# Patient Record
Sex: Female | Born: 1965
Health system: Southern US, Community
[De-identification: ages and names within clinical notes are randomized; demographics above are authoritative.]

## PROBLEM LIST (undated history)

## (undated) DIAGNOSIS — J45909 Unspecified asthma, uncomplicated: Secondary | ICD-10-CM

## (undated) DIAGNOSIS — R569 Unspecified convulsions: Secondary | ICD-10-CM

## (undated) DIAGNOSIS — E119 Type 2 diabetes mellitus without complications: Secondary | ICD-10-CM

## (undated) DIAGNOSIS — F319 Bipolar disorder, unspecified: Secondary | ICD-10-CM

## (undated) DIAGNOSIS — I10 Essential (primary) hypertension: Secondary | ICD-10-CM

## (undated) HISTORY — PX: BACK SURGERY: SHX140

---

## 2003-03-30 ENCOUNTER — Encounter: Admission: RE | Admit: 2003-03-30 | Discharge: 2003-03-30 | Payer: Self-pay | Admitting: Family Medicine

## 2005-07-06 ENCOUNTER — Other Ambulatory Visit: Payer: Self-pay

## 2005-07-06 ENCOUNTER — Inpatient Hospital Stay: Payer: Self-pay | Admitting: Psychiatry

## 2005-07-11 ENCOUNTER — Ambulatory Visit: Payer: Self-pay | Admitting: Unknown Physician Specialty

## 2005-08-03 ENCOUNTER — Ambulatory Visit: Payer: Self-pay | Admitting: Unknown Physician Specialty

## 2005-08-28 ENCOUNTER — Emergency Department: Payer: Self-pay | Admitting: Emergency Medicine

## 2005-09-03 ENCOUNTER — Ambulatory Visit: Payer: Self-pay | Admitting: Unknown Physician Specialty

## 2005-09-05 ENCOUNTER — Emergency Department: Payer: Self-pay | Admitting: Emergency Medicine

## 2008-12-29 ENCOUNTER — Encounter: Admission: RE | Admit: 2008-12-29 | Discharge: 2008-12-29 | Payer: Self-pay | Admitting: Specialist

## 2009-01-03 ENCOUNTER — Inpatient Hospital Stay (HOSPITAL_COMMUNITY): Admission: AD | Admit: 2009-01-03 | Discharge: 2009-01-05 | Payer: Self-pay | Admitting: Specialist

## 2009-02-20 ENCOUNTER — Inpatient Hospital Stay (HOSPITAL_COMMUNITY): Admission: EM | Admit: 2009-02-20 | Discharge: 2009-02-25 | Payer: Self-pay | Admitting: Emergency Medicine

## 2009-02-21 ENCOUNTER — Encounter (INDEPENDENT_AMBULATORY_CARE_PROVIDER_SITE_OTHER): Payer: Self-pay | Admitting: Internal Medicine

## 2009-02-22 ENCOUNTER — Encounter: Payer: Self-pay | Admitting: Internal Medicine

## 2009-02-22 ENCOUNTER — Encounter (INDEPENDENT_AMBULATORY_CARE_PROVIDER_SITE_OTHER): Payer: Self-pay | Admitting: Internal Medicine

## 2009-02-24 ENCOUNTER — Ambulatory Visit: Payer: Self-pay | Admitting: Psychiatry

## 2010-04-22 LAB — LIPID PANEL
HDL: 39 mg/dL — ABNORMAL LOW (ref >39)
Triglycerides: 105 mg/dL (ref <150)
VLDL: 21 mg/dL (ref 0–40)

## 2010-04-22 LAB — CBC
HCT: 37.3 % (ref 36.0–46.0)
HCT: 34.9 % — ABNORMAL LOW (ref 36.0–46.0)
HCT: 37.1 % (ref 36.0–46.0)
Hemoglobin: 12.6 g/dL (ref 12.0–15.0)
Hemoglobin: 15.4 g/dL — ABNORMAL HIGH (ref 12.0–15.0)
Hemoglobin: 12.2 g/dL (ref 12.0–15.0)
Hemoglobin: 13 g/dL (ref 12.0–15.0)
MCHC: 33.5 g/dL (ref 30.0–36.0)
MCHC: 33.8 g/dL (ref 30.0–36.0)
MCHC: 34.7 g/dL (ref 30.0–36.0)
MCHC: 34.9 g/dL (ref 30.0–36.0)
MCV: 96.5 fL (ref 78.0–100.0)
MCV: 97.3 fL (ref 78.0–100.0)
MCV: 97.4 fL (ref 78.0–100.0)
Platelets: 250 10*3/uL (ref 150–400)
Platelets: 190 10*3/uL (ref 150–400)
Platelets: 196 K/uL (ref 150–400)
Platelets: 203 10*3/uL (ref 150–400)
RBC: 4.72 MIL/uL (ref 3.87–5.11)
RBC: 3.58 MIL/uL — ABNORMAL LOW (ref 3.87–5.11)
RDW: 12.7 % (ref 11.5–15.5)
RDW: 12.6 % (ref 11.5–15.5)
RDW: 12.6 % (ref 11.5–15.5)
RDW: 12.7 % (ref 11.5–15.5)
WBC: 11.2 K/uL — ABNORMAL HIGH (ref 4.0–10.5)
WBC: 4.7 10*3/uL (ref 4.0–10.5)

## 2010-04-22 LAB — GLUCOSE, CAPILLARY
Glucose-Capillary: 157 mg/dL — ABNORMAL HIGH (ref 70–99)
Glucose-Capillary: 109 mg/dL — ABNORMAL HIGH (ref 70–99)
Glucose-Capillary: 111 mg/dL — ABNORMAL HIGH (ref 70–99)
Glucose-Capillary: 112 mg/dL — ABNORMAL HIGH (ref 70–99)
Glucose-Capillary: 113 mg/dL — ABNORMAL HIGH (ref 70–99)
Glucose-Capillary: 117 mg/dL — ABNORMAL HIGH (ref 70–99)
Glucose-Capillary: 124 mg/dL — ABNORMAL HIGH (ref 70–99)
Glucose-Capillary: 128 mg/dL — ABNORMAL HIGH (ref 70–99)
Glucose-Capillary: 132 mg/dL — ABNORMAL HIGH (ref 70–99)
Glucose-Capillary: 65 mg/dL — ABNORMAL LOW (ref 70–99)
Glucose-Capillary: 83 mg/dL (ref 70–99)
Glucose-Capillary: 87 mg/dL (ref 70–99)
Glucose-Capillary: 91 mg/dL (ref 70–99)
Glucose-Capillary: 91 mg/dL (ref 70–99)
Glucose-Capillary: 94 mg/dL (ref 70–99)

## 2010-04-22 LAB — COMPREHENSIVE METABOLIC PANEL
ALT: <8 U/L (ref 0–35)
Alkaline Phosphatase: 63 U/L (ref 39–117)
CO2: 12 mEq/L — ABNORMAL LOW (ref 19–32)
GFR calc non Af Amer: 34 mL/min — ABNORMAL LOW (ref >60)
Glucose, Bld: 222 mg/dL — ABNORMAL HIGH (ref 70–99)
Potassium: 3.5 mEq/L (ref 3.5–5.1)
Sodium: 140 mEq/L (ref 135–145)

## 2010-04-22 LAB — RAPID URINE DRUG SCREEN, HOSP PERFORMED
Benzodiazepines: NOT DETECTED
Cocaine: NOT DETECTED
Opiates: NOT DETECTED

## 2010-04-22 LAB — BLOOD GAS, ARTERIAL
Drawn by: 257701
O2 Content: 2 L/min
Patient temperature: 98.6
pCO2 arterial: 31.8 mmHg — ABNORMAL LOW (ref 35.0–45.0)
pH, Arterial: 7.422 — ABNORMAL HIGH (ref 7.350–7.400)

## 2010-04-22 LAB — CK TOTAL AND CKMB (NOT AT ARMC): Total CK: 1728 U/L — ABNORMAL HIGH (ref 7–177)

## 2010-04-22 LAB — BASIC METABOLIC PANEL
BUN: 10 mg/dL (ref 6–23)
BUN: 6 mg/dL (ref 6–23)
CO2: 20 mEq/L (ref 19–32)
Calcium: 8.2 mg/dL — ABNORMAL LOW (ref 8.4–10.5)
Chloride: 108 mEq/L (ref 96–112)
Glucose, Bld: 104 mg/dL — ABNORMAL HIGH (ref 70–99)
Glucose, Bld: 95 mg/dL (ref 70–99)
Potassium: 3.2 mEq/L — ABNORMAL LOW (ref 3.5–5.1)
Potassium: 3.5 mEq/L (ref 3.5–5.1)
Sodium: 140 mEq/L (ref 135–145)
Sodium: 144 mEq/L (ref 135–145)

## 2010-04-22 LAB — PHOSPHORUS: Phosphorus: 2.6 mg/dL (ref 2.3–4.6)

## 2010-04-22 LAB — COMPREHENSIVE METABOLIC PANEL WITH GFR
ALT: 17 U/L (ref 0–35)
AST: 32 U/L (ref 0–37)
Albumin: 3.4 g/dL — ABNORMAL LOW (ref 3.5–5.2)
Alkaline Phosphatase: 41 U/L (ref 39–117)
BUN: 3 mg/dL — ABNORMAL LOW (ref 6–23)
CO2: 18 meq/L — ABNORMAL LOW (ref 19–32)
Calcium: 8.5 mg/dL (ref 8.4–10.5)
Chloride: 111 meq/L (ref 96–112)
Creatinine, Ser: 0.61 mg/dL (ref 0.4–1.2)
GFR calc non Af Amer: >60 mL/min
Glucose, Bld: 83 mg/dL (ref 70–99)
Potassium: 4.2 meq/L (ref 3.5–5.1)
Sodium: 140 meq/L (ref 135–145)
Total Bilirubin: 1.6 mg/dL — ABNORMAL HIGH (ref 0.3–1.2)
Total Protein: 6.2 g/dL (ref 6.0–8.3)

## 2010-04-22 LAB — BLOOD GAS, VENOUS
Bicarbonate: 10.4 mEq/L — ABNORMAL LOW (ref 20.0–24.0)
O2 Saturation: 94.2 %
Patient temperature: 98.6
TCO2: 9.3 mmol/L (ref 0–100)
pH, Ven: 7.245 — ABNORMAL LOW (ref 7.250–7.300)

## 2010-04-22 LAB — BASIC METABOLIC PANEL WITH GFR
BUN: <1 mg/dL — ABNORMAL LOW (ref 6–23)
CO2: 25 meq/L (ref 19–32)
Calcium: 8.7 mg/dL (ref 8.4–10.5)
Chloride: 108 meq/L (ref 96–112)
Creatinine, Ser: 0.61 mg/dL (ref 0.4–1.2)
GFR calc non Af Amer: >60 mL/min
Glucose, Bld: 99 mg/dL (ref 70–99)
Potassium: 3.3 meq/L — ABNORMAL LOW (ref 3.5–5.1)
Sodium: 141 meq/L (ref 135–145)

## 2010-04-22 LAB — URINALYSIS, ROUTINE W REFLEX MICROSCOPIC
Ketones, ur: 40 mg/dL — AB
Leukocytes, UA: NEGATIVE
Nitrite: NEGATIVE
Nitrite: NEGATIVE
Protein, ur: 30 mg/dL — AB
Protein, ur: NEGATIVE mg/dL
Specific Gravity, Urine: 1.024 (ref 1.005–1.030)
Urobilinogen, UA: 0.2 mg/dL (ref 0.0–1.0)

## 2010-04-22 LAB — CULTURE, BLOOD (ROUTINE X 2)
Culture: NO GROWTH
Culture: NO GROWTH
Culture: NO GROWTH
Culture: NO GROWTH

## 2010-04-22 LAB — DIFFERENTIAL
Basophils Relative: 0 % (ref 0–1)
Eosinophils Absolute: 0 10*3/uL (ref 0.0–0.7)
Neutrophils Relative %: 78 % — ABNORMAL HIGH (ref 43–77)

## 2010-04-22 LAB — URINE CULTURE: Culture: NO GROWTH

## 2010-04-22 LAB — MAGNESIUM
Magnesium: 2 mg/dL (ref 1.5–2.5)
Magnesium: 2.1 mg/dL (ref 1.5–2.5)
Magnesium: 2.2 mg/dL (ref 1.5–2.5)

## 2010-04-22 LAB — CSF CELL COUNT WITH DIFFERENTIAL: WBC, CSF: 0 /mm3 (ref 0–5)

## 2010-04-22 LAB — CARDIAC PANEL(CRET KIN+CKTOT+MB+TROPI)
CK, MB: 3.9 ng/mL (ref 0.3–4.0)
Troponin I: 0.16 ng/mL — ABNORMAL HIGH (ref 0.00–0.06)

## 2010-04-22 LAB — D-DIMER, QUANTITATIVE: D-Dimer, Quant: 0.61 ug/mL-FEU — ABNORMAL HIGH (ref 0.00–0.48)

## 2010-04-22 LAB — PROTEIN AND GLUCOSE, CSF
Glucose, CSF: 66 mg/dL (ref 43–76)
Total  Protein, CSF: 51 mg/dL — ABNORMAL HIGH (ref 15–45)

## 2010-04-22 LAB — T3, FREE: T3, Free: 2.2 pg/mL — ABNORMAL LOW (ref 2.3–4.2)

## 2010-04-22 LAB — C-REACTIVE PROTEIN: CRP: 2.3 mg/dL — ABNORMAL HIGH

## 2010-04-22 LAB — PROTIME-INR
INR: 1.04 (ref 0.00–1.49)
Prothrombin Time: 13.5 s (ref 11.6–15.2)

## 2010-04-22 LAB — TSH: TSH: 0.434 u[IU]/mL (ref 0.350–4.500)

## 2010-04-22 LAB — AMMONIA
Ammonia: 124 umol/L — ABNORMAL HIGH (ref 11–35)
Ammonia: 20 umol/L (ref 11–35)

## 2010-04-22 LAB — CK: Total CK: 359 U/L — ABNORMAL HIGH (ref 7–177)

## 2010-04-22 LAB — ETHANOL: Alcohol, Ethyl (B): <5 mg/dL (ref 0–10)

## 2010-04-22 LAB — TROPONIN I: Troponin I: 0.09 ng/mL — ABNORMAL HIGH (ref 0.00–0.06)

## 2010-04-22 LAB — POCT CARDIAC MARKERS: Myoglobin, poc: >500 ng/mL (ref 12–200)

## 2010-04-22 LAB — SEDIMENTATION RATE: Sed Rate: 41 mm/h — ABNORMAL HIGH (ref 0–22)

## 2010-04-22 LAB — URINE MICROSCOPIC-ADD ON

## 2010-04-22 LAB — SALICYLATE LEVEL: Salicylate Lvl: <4 mg/dL (ref 2.8–20.0)

## 2010-04-22 LAB — POCT PREGNANCY, URINE: Preg Test, Ur: NEGATIVE

## 2010-05-07 LAB — CBC
HCT: 38.5 % (ref 36.0–46.0)
HCT: 42.9 % (ref 36.0–46.0)
Hemoglobin: 14.6 g/dL (ref 12.0–15.0)
MCHC: 34 g/dL (ref 30.0–36.0)
MCV: 95.1 fL (ref 78.0–100.0)
MCV: 96.1 fL (ref 78.0–100.0)
Platelets: 196 10*3/uL (ref 150–400)
RBC: 4.51 MIL/uL (ref 3.87–5.11)
RDW: 12.6 % (ref 11.5–15.5)
RDW: 12.9 % (ref 11.5–15.5)

## 2010-05-07 LAB — URINALYSIS, ROUTINE W REFLEX MICROSCOPIC
Bilirubin Urine: NEGATIVE
Nitrite: NEGATIVE
Protein, ur: NEGATIVE mg/dL
Specific Gravity, Urine: 1.025 (ref 1.005–1.030)
Urobilinogen, UA: 0.2 mg/dL (ref 0.0–1.0)

## 2010-05-07 LAB — COMPREHENSIVE METABOLIC PANEL
AST: 13 U/L (ref 0–37)
CO2: 22 mEq/L (ref 19–32)
Chloride: 103 mEq/L (ref 96–112)
Creatinine, Ser: 0.69 mg/dL (ref 0.4–1.2)
GFR calc Af Amer: >60 mL/min (ref >60)
GFR calc non Af Amer: >60 mL/min (ref >60)
Total Bilirubin: 0.8 mg/dL (ref 0.3–1.2)

## 2010-05-07 LAB — PREGNANCY, URINE: Preg Test, Ur: NEGATIVE

## 2010-05-07 LAB — DIFFERENTIAL
Basophils Absolute: 0 10*3/uL (ref 0.0–0.1)
Basophils Relative: 0 % (ref 0–1)
Eosinophils Absolute: 0 10*3/uL (ref 0.0–0.7)
Eosinophils Relative: 0 % (ref 0–5)
Lymphocytes Relative: 11 % — ABNORMAL LOW (ref 12–46)

## 2010-05-07 LAB — APTT: aPTT: 31 seconds (ref 24–37)

## 2010-05-07 LAB — URINE MICROSCOPIC-ADD ON

## 2014-12-13 ENCOUNTER — Encounter: Payer: Self-pay | Admitting: *Deleted

## 2014-12-13 ENCOUNTER — Emergency Department
Admission: EM | Admit: 2014-12-13 | Discharge: 2014-12-13 | Disposition: A | Discharge status: Home / Self Care | Payer: Self-pay | Attending: Emergency Medicine | Admitting: Emergency Medicine

## 2014-12-13 ENCOUNTER — Emergency Department: Payer: Self-pay

## 2014-12-13 DIAGNOSIS — Z3202 Encounter for pregnancy test, result negative: Secondary | ICD-10-CM | POA: Insufficient documentation

## 2014-12-13 DIAGNOSIS — R109 Unspecified abdominal pain: Secondary | ICD-10-CM

## 2014-12-13 DIAGNOSIS — R252 Cramp and spasm: Secondary | ICD-10-CM | POA: Insufficient documentation

## 2014-12-13 DIAGNOSIS — R195 Other fecal abnormalities: Secondary | ICD-10-CM

## 2014-12-13 DIAGNOSIS — E119 Type 2 diabetes mellitus without complications: Secondary | ICD-10-CM | POA: Insufficient documentation

## 2014-12-13 DIAGNOSIS — R55 Syncope and collapse: Secondary | ICD-10-CM | POA: Insufficient documentation

## 2014-12-13 DIAGNOSIS — Z79899 Other long term (current) drug therapy: Secondary | ICD-10-CM | POA: Insufficient documentation

## 2014-12-13 DIAGNOSIS — Z720 Tobacco use: Secondary | ICD-10-CM | POA: Insufficient documentation

## 2014-12-13 DIAGNOSIS — R0789 Other chest pain: Secondary | ICD-10-CM | POA: Insufficient documentation

## 2014-12-13 DIAGNOSIS — I10 Essential (primary) hypertension: Secondary | ICD-10-CM | POA: Insufficient documentation

## 2014-12-13 DIAGNOSIS — R197 Diarrhea, unspecified: Secondary | ICD-10-CM | POA: Insufficient documentation

## 2014-12-13 HISTORY — DX: Essential (primary) hypertension: I10

## 2014-12-13 HISTORY — DX: Unspecified asthma, uncomplicated: J45.909

## 2014-12-13 HISTORY — DX: Unspecified convulsions: R56.9

## 2014-12-13 HISTORY — DX: Type 2 diabetes mellitus without complications: E11.9

## 2014-12-13 HISTORY — DX: Bipolar disorder, unspecified: F31.9

## 2014-12-13 LAB — URINALYSIS COMPLETE WITH MICROSCOPIC (ARMC ONLY)
BILIRUBIN URINE: NEGATIVE
Bacteria, UA: NONE SEEN
Glucose, UA: NEGATIVE mg/dL
HGB URINE DIPSTICK: NEGATIVE
LEUKOCYTES UA: NEGATIVE
NITRITE: NEGATIVE
PH: 6 (ref 5.0–8.0)
Protein, ur: 100 mg/dL — AB
SPECIFIC GRAVITY, URINE: 1.016 (ref 1.005–1.030)

## 2014-12-13 LAB — BASIC METABOLIC PANEL
ANION GAP: 6 (ref 5–15)
BUN: 11 mg/dL (ref 6–20)
CHLORIDE: 102 mmol/L (ref 101–111)
CO2: 27 mmol/L (ref 22–32)
Calcium: 9.1 mg/dL (ref 8.9–10.3)
Creatinine, Ser: 0.91 mg/dL (ref 0.44–1.00)
GFR calc Af Amer: >60 mL/min (ref >60)
Glucose, Bld: 134 mg/dL — ABNORMAL HIGH (ref 65–99)
POTASSIUM: 3.8 mmol/L (ref 3.5–5.1)
SODIUM: 135 mmol/L (ref 135–145)

## 2014-12-13 LAB — CBC
HEMATOCRIT: 41.5 % (ref 35.0–47.0)
HEMOGLOBIN: 13.4 g/dL (ref 12.0–16.0)
MCH: 28.4 pg (ref 26.0–34.0)
MCHC: 32.4 g/dL (ref 32.0–36.0)
MCV: 87.7 fL (ref 80.0–100.0)
Platelets: 317 10*3/uL (ref 150–440)
RBC: 4.73 MIL/uL (ref 3.80–5.20)
RDW: 14.6 % — ABNORMAL HIGH (ref 11.5–14.5)
WBC: 12 10*3/uL — AB (ref 3.6–11.0)

## 2014-12-13 LAB — PREGNANCY, URINE: Preg Test, Ur: NEGATIVE

## 2014-12-13 LAB — TROPONIN I: Troponin I: <0.03 ng/mL (ref <0.031)

## 2014-12-13 MED ORDER — SODIUM CHLORIDE 0.9 % IV BOLUS (SEPSIS)
1000.0000 mL | Freq: Once | INTRAVENOUS | Status: AC
  Administered 2014-12-13: 1000 mL via INTRAVENOUS

## 2014-12-13 MED ORDER — ACETAMINOPHEN 500 MG PO TABS
1000.0000 mg | ORAL_TABLET | ORAL | Status: AC
  Administered 2014-12-13: 1000 mg via ORAL
  Filled 2014-12-13: qty 2

## 2014-12-13 MED ORDER — ONDANSETRON HCL 4 MG/2ML IJ SOLN
4.0000 mg | Freq: Once | INTRAMUSCULAR | Status: AC
  Administered 2014-12-13: 4 mg via INTRAVENOUS
  Filled 2014-12-13: qty 2

## 2014-12-13 NOTE — ED Notes (Signed)
Patient transported to X-ray 

## 2014-12-13 NOTE — ED Notes (Addendum)
Pt to ED via EMS from home with c/c of left sided rib pain/chest pain, along with numbness in left arm x 4 days. Per EMS x 4 hours today abd cramping lower quads bilaterally. LMP x 2 months ago, last BM x 3 days ago. EMS states pt became diaphoretic while using restroom, was slumped over when husband walked in to check on pt, "felt like i was going to pass out but i didn't" Pt AAOx3 on arrival, appears pale in color, vitals wnl at this time. Pain 7/10 BGL 103 per EMS

## 2014-12-13 NOTE — ED Notes (Signed)
Lab called regarding urine preg add on, will add on at this time

## 2014-12-13 NOTE — ED Provider Notes (Signed)
[facility] Emergency Department Provider Note REMINDER - THIS NOTE IS NOT A FINAL MEDICAL RECORD UNTIL IT IS SIGNED. UNTIL THEN, THE CONTENT BELOW MAY REFLECT INFORMATION FROM A DOCUMENTATION TEMPLATE, NOT THE ACTUAL PATIENT VISIT. ____________________________________________  Time seen: Approximately 5:55 PM  I have reviewed the triage vital signs and the nursing notes.   HISTORY  Chief Complaint Chest Pain and Loss of Consciousness    HPI Karen Perry is a 49 y.o. female who presents for evaluation of approximately 3 days of an aching pain that runs from her left back into her left-sided ribs and chest.She reports that she gets sharp discomfort in the left chest especially when she lays down at night, not associated with any shortness of breath. She's been told she has a herniated disc in her mid back and believes the pain may be radiating out from that area.  No numbness or tingling except she had a brief episode where her arm felt a little tingly after a blood pressure cuff was applied at home on it and this resolved once the blood pressure is removed. She denies a nausea or vomiting.  Patient also notes that today she went out and had lunch, she then the having crampy pain and felt constipated. While sitting on the toilet to have a bowel movement she felt lightheaded, but did not pass out. She had very sweaty, slumped over and felt as though she is going to pass out. She now reports having had approximately 3 loose stools and diarrhea the ER with some crampy lower abdominal discomfort.  No fevers or chills. No trouble breathing. She does not believe she is pregnant the last period was 2 months ago. The patient also tells me she does not wish to be treated with any narcotics as she has a previous history of addiction.   Past Medical History  Diagnosis Date  . Asthma   . Hypertension   . Diabetes mellitus without complication (HCC)   . Seizures  (HCC)   . Bipolar 1 disorder (HCC)     There are no active problems to display for this patient.   Past Surgical History  Procedure Laterality Date  . Back surgery      Current Outpatient Rx  Name  Route  Sig  Dispense  Refill  . busPIRone (BUSPAR) 10 MG tablet   Oral   Take 10 mg by mouth 2 (two) times daily.         . fluPHENAZine (PROLIXIN) 1 MG tablet   Oral   Take 1 mg by mouth at bedtime.         . lamoTRIgine (LAMICTAL) 100 MG tablet   Oral   Take 200 mg by mouth daily.         . traZODone (DESYREL) 50 MG tablet   Oral   Take 25-50 mg by mouth at bedtime as needed for sleep.           Allergies Septra  History reviewed. No pertinent family history.  Social History Social History  Substance Use Topics  . Smoking status: Current Every Day Smoker -- 0.50 packs/day    Types: Cigarettes  . Smokeless tobacco: None  . Alcohol Use: No    Review of Systems Constitutional: No fever/chills Eyes: No visual changes. ENT: No sore throat. Cardiovascular: See history of present illness Respiratory: Denies shortness of breath. Gastrointestinal: No abdominal pain.  No nausea, no vomiting.  No diarrhea.  No constipation. Genitourinary: Negative for dysuria. Musculoskeletal:  Negative for back pain. Skin: Negative for rash. Neurological: Negative for headaches, focal weakness or numbness.  10-point ROS otherwise negative. No leg swelling, no recent surgeries or trauma. No previous history of blood clots. She is a smoker but does not take any estrogen or hormones. No previous history of any DVT. Denies shortness of breath. No cough or hemoptysis. ____________________________________________   PHYSICAL EXAM:  VITAL SIGNS: ED Triage Vitals  Enc Vitals Group     BP 12/13/14 1609 117/84 mmHg     Pulse Rate 12/13/14 1609 92     Resp 12/13/14 1609 14     Temp 12/13/14 1609 98.1 F (36.7 C)     Temp Source 12/13/14 1609 Oral     SpO2 12/13/14 1609 98 %      Weight 12/13/14 1609 207 lb (93.895 kg)     Height 12/13/14 1609  (1.753 m)     Head Cir --      Peak Flow --      Pain Score 12/13/14 1610 7     Pain Loc --      Pain Edu? --      Excl. in GC? --    Constitutional: Alert and oriented. Well appearing and in no acute distress. Eyes: Conjunctivae are normal. PERRL. EOMI. Head: Atraumatic. Nose: No congestion/rhinnorhea. Mouth/Throat: Mucous membranes are moist.  Oropharynx non-erythematous. Neck: No stridor.   Cardiovascular: Normal rate, regular rhythm. Grossly normal heart sounds.  Good peripheral circulation. Respiratory: Normal respiratory effort.  No retractions. Lungs CTAB. Gastrointestinal: Soft and nontender, except for the patient reports a crampy feeling with palpation over the bladder and suprapubic region. No distention. No abdominal bruits. No CVA tenderness. Musculoskeletal: No lower extremity tenderness nor edema.  No joint effusions. Neurologic:  Normal speech and language. No gross focal neurologic deficits are appreciated. No gait instability. Skin:  Skin is warm, dry and intact. No rash noted. Psychiatric: Mood and affect are normal. Speech and behavior are normal.  ____________________________________________   LABS (all labs ordered are listed, but only abnormal results are displayed)  Labs Reviewed  BASIC METABOLIC PANEL - Abnormal; Notable for the following:    Glucose, Bld 134 (*)    All other components within normal limits  CBC - Abnormal; Notable for the following:    WBC 12.0 (*)    RDW 14.6 (*)    All other components within normal limits  URINALYSIS COMPLETEWITH MICROSCOPIC ([facility] ONLY) - Abnormal; Notable for the following:    Color, Urine AMBER (*)    APPearance CLEAR (*)    Ketones, ur TRACE (*)    Protein, ur 100 (*)    Squamous Epithelial / LPF 0-5 (*)    All other components within normal limits  TROPONIN I  CBG MONITORING, ED    ____________________________________________  EKG  ED ECG REPORT I, QUALE, MARK, the attending physician, personally viewed and interpreted this ECG.  Date: 12/13/2014 EKG Time: 1608 Rate: 97 Rhythm: normal sinus rhythm QRS Axis: normal Intervals: normal ST/T Wave abnormalities: normal Conduction Disutrbances: none Narrative Interpretation: unremarkable  ____________________________________________  RADIOLOGY  DG Chest 2 View (Final result) Result time: 12/13/14 17:01:38   Final result by Rad Results In Interface (12/13/14 17:01:38)   Narrative:   CLINICAL DATA: Left-sided chest pain for 4 days  EXAM: CHEST - 2 VIEW  COMPARISON: 02/20/2009  FINDINGS: Cardiac shadow is within normal limits. The lungs are well aerated bilaterally. No focal infiltrate or sizable effusion is seen. No acute bony  abnormality is noted.  IMPRESSION: No active disease.     ____________________________________________   PROCEDURES  Procedure(s) performed: None  Critical Care performed: No  ____________________________________________   INITIAL IMPRESSION / ASSESSMENT AND PLAN / ED COURSE  Pertinent labs & imaging results that were available during my care of the patient were reviewed by me and considered in my medical decision making (see chart for details).  Patient presents for a multiple concerns, some which are hard to correlate into one etiology. She does note 3-4 days of achy sharp pain that shoots up from the left back towards the left ribs and chest. This is very atypical coronary syndrome, she does not a history of coronary disease though she does have diabetes. EKG is completely normal and her first troponin is normal. I find this extremely unlikely and her heart score indicates she is low risk. She has no significant risk factor and is perc negative for pulmonary embolism. No signs or symptoms of dissection. No evidence pneumothorax with clear chest x-ray.  She  did have also what sounds to be some sort of episode where she almost passed out today while feeling constipated, now having 3-4 loose nonbloody stools and reporting her symptoms are better except for crampy abdominal pain. I'll give her Zofran and hydrate, her abdominal exam is only indicative of some mild cramping with palpation suprapubically but no vaginal symptoms and no evidence of peritonitis, fever or signs or symptoms of infection.  She does have a mild leukocytosis, I will check a urinalysis and repeat her abdominal exam after hydration and Zofran.      Pulmonary Embolism Rule-out Criteria (PERC rule)                        If YES to ANY of the following, the PERC rule is not satisfied and cannot be used to rule out PE in this patient (consider d-dimer or imaging depending on pre-test probability).                      If NO to ALL of the following, AND the clinician's pre-test probability is <15%, the [facility] rule is satisfied and there is no need for further workup (including no need to obtain a d-dimer) as the post-test probability of pulmonary embolism is <2%.                      Mnemonic is HAD CLOTS   H - hormone use (exogenous estrogen)      No. A - age > 50                                                 No. D - DVT/PE history                                      No.   C - coughing blood (hemoptysis)                 No. L - leg swelling, unilateral                             No. O - O2 Sat on Room  Air < 95%                  No. T - tachycardia (HR ? 100)                         No. S - surgery or trauma, recent                      No.   Based on my evaluation of the patient, including application of this decision instrument, further testing to evaluate for pulmonary embolism is not indicated at this time. I have discussed this recommendation with the patient who states understanding and agreement with this plan.  ----------------------------------------- 8:03 PM on  12/13/2014 -----------------------------------------  Patient reports feeling improvement. No further loose stools, she reports that her abdominal pain is gone. Repeat exam she has no abdominal pain or tenderness. She reports that her chest pain is also improved, and she is currently resting comfortably. I discussed careful return precautions, including if she has worsening chest pain, short of breath, feels weak lightheaded or feels as though she may pass out again, develops a fever, severe abdominal pain, bloody stools, nausea or vomiting then she'll return emergency room right away.  At this point, patient appears stable, her pain and symptoms are much improved. Careful return precautions and follow-up plan in place, the patient is agreeable. No evidence of acute intra-abdominal infection with reassuring repeat exam. Diarrhea resolved. Low risk heart score and atypical chest discomfort, normal EKG normal troponin. We'll discharge the patient home with close follow-up care advised.  ____________________________________________   FINAL CLINICAL IMPRESSION(S) / ED DIAGNOSES  Final diagnoses:  Chest wall discomfort  Loose stools  Cramp, abdominal  Vasovagal episode      Sharyn Creamer, MD 12/13/14 2006

## 2014-12-13 NOTE — Discharge Instructions (Signed)
You have been seen in the Emergency Department (ED) today for chest pain.  As we have discussed todays test results are normal, but you may require further testing.  Please follow up with the recommended doctor as instructed above in these documents regarding todays emergent visit and your recent symptoms to discuss further management.  Continue to take your regular medications. If you are not doing so already, please also take a daily baby aspirin (81 mg), at least until you follow up with your doctor.  Return to the Emergency Department (ED) if you experience any further chest pain/pressure/tightness, difficulty breathing, or sudden sweating, or other symptoms that concern you.   Abdominal Pain, Adult Many things can cause abdominal pain. Usually, abdominal pain is not caused by a disease and will improve without treatment. It can often be observed and treated at home. Your health care provider will do a physical exam and possibly order blood tests and X-rays to help determine the seriousness of your pain. However, in many cases, more time must pass before a clear cause of the pain can be found. Before that point, your health care provider may not know if you need more testing or further treatment. HOME CARE INSTRUCTIONS Monitor your abdominal pain for any changes. The following actions may help to alleviate any discomfort you are experiencing:  Only take over-the-counter or prescription medicines as directed by your health care provider.  Do not take laxatives unless directed to do so by your health care provider.  Try a clear liquid diet (broth, tea, or water) as directed by your health care provider. Slowly move to a bland diet as tolerated. SEEK MEDICAL CARE IF:  You have unexplained abdominal pain.  You have abdominal pain associated with nausea or diarrhea.  You have pain when you urinate or have a bowel movement.  You experience abdominal pain that wakes you in the night.  You  have abdominal pain that is worsened or improved by eating food.  You have abdominal pain that is worsened with eating fatty foods.  You have a fever. SEEK IMMEDIATE MEDICAL CARE IF:  Your pain does not go away within 2 hours.  You keep throwing up (vomiting).  Your pain is felt only in portions of the abdomen, such as the right side or the left lower portion of the abdomen.  You pass bloody or black tarry stools. MAKE SURE YOU:  Understand these instructions.  Will watch your condition.  Will get help right away if you are not doing well or get worse.   This information is not intended to replace advice given to you by your health care provider. Make sure you discuss any questions you have with your health care provider.   Document Released: 10/30/2004 Document Revised: 10/11/2014 Document Reviewed: 09/29/2012 Elsevier Interactive Patient Education 2016 [employer]  Chest Pain Observation It is often hard to give a specific diagnosis for the cause of chest pain. Among other possibilities your symptoms might be caused by inadequate oxygen delivery to your heart (angina). Angina that is not treated or evaluated can lead to a heart attack (myocardial infarction) or death. Blood tests, electrocardiograms, and X-rays may have been done to help determine a possible cause of your chest pain. After evaluation and observation, your health care provider has determined that it is unlikely your pain was caused by an unstable condition that requires hospitalization. However, a full evaluation of your pain may need to be completed, with additional diagnostic testing as directed. It  is very important to keep your follow-up appointments. Not keeping your follow-up appointments could result in permanent heart damage, disability, or death. If there is any problem keeping your follow-up appointments, you must call your health care provider. HOME CARE INSTRUCTIONS  Due to the slight chance that your  pain could be angina, it is important to follow your health care provider's treatment plan and also maintain a healthy lifestyle:  Maintain or work toward achieving a healthy weight.  Stay physically active and exercise regularly.  Decrease your salt intake.  Eat a balanced, healthy diet. Talk to a dietitian to learn about heart-healthy foods.  Increase your fiber intake by including whole grains, vegetables, fruits, and nuts in your diet.  Avoid situations that cause stress, anger, or depression.  Take medicines as advised by your health care provider. Report any side effects to your health care provider. Do not stop medicines or adjust the dosages on your own.  Quit smoking. Do not use nicotine patches or gum until you check with your health care provider.  Keep your blood pressure, blood sugar, and cholesterol levels within normal limits.  Limit alcohol intake to no more than 1 drink per day for women who are not pregnant and 2 drinks per day for men.  Do not abuse drugs. SEEK IMMEDIATE MEDICAL CARE IF: You have severe chest pain or pressure which may include symptoms such as:  You feel pain or pressure in your arms, neck, jaw, or back.  You have severe back or abdominal pain, feel sick to your stomach (nauseous), or throw up (vomit).  You are sweating profusely.  You are having a fast or irregular heartbeat.  You feel short of breath while at rest.  You notice increasing shortness of breath during rest, sleep, or with activity.  You have chest pain that does not get better after rest or after taking your usual medicine.  You wake from sleep with chest pain.  You are unable to sleep because you cannot breathe.  You develop a frequent cough or you are coughing up blood.  You feel dizzy, faint, or experience extreme fatigue.  You develop severe weakness, dizziness, fainting, or chills. Any of these symptoms may represent a serious problem that is an emergency. Do not  wait to see if the symptoms will go away. Call your local emergency services (911 in the [place] Do not drive yourself to the hospital. MAKE SURE YOU:  Understand these instructions.  Will watch your condition.  Will get help right away if you are not doing well or get worse.   This information is not intended to replace advice given to you by your health care provider. Make sure you discuss any questions you have with your health care provider.   Document Released: 02/22/2010 Document Revised: 01/25/2013 Document Reviewed: 07/22/2012 Elsevier Interactive Patient Education [employer]

## 2014-12-13 NOTE — ED Notes (Signed)
Pt will be d/c once preg results. Pt made aware and verbalized understanding at this time

## 2015-02-22 ENCOUNTER — Ambulatory Visit: Payer: Self-pay | Admitting: Cardiovascular Disease

## 2018-01-08 ENCOUNTER — Other Ambulatory Visit: Payer: Self-pay | Admitting: Internal Medicine

## 2018-01-08 DIAGNOSIS — Q891 Congenital malformations of adrenal gland: Secondary | ICD-10-CM

## 2018-01-08 DIAGNOSIS — R7989 Other specified abnormal findings of blood chemistry: Secondary | ICD-10-CM

## 2018-01-08 DIAGNOSIS — I10 Essential (primary) hypertension: Secondary | ICD-10-CM

## 2018-02-10 ENCOUNTER — Ambulatory Visit
Admission: RE | Admit: 2018-02-10 | Discharge: 2018-02-10 | Disposition: A | Discharge status: Home / Self Care | Payer: Medicare HMO | Source: Ambulatory Visit | Attending: Internal Medicine | Admitting: Internal Medicine

## 2018-02-10 DIAGNOSIS — Q891 Congenital malformations of adrenal gland: Secondary | ICD-10-CM | POA: Insufficient documentation

## 2018-02-10 DIAGNOSIS — R7989 Other specified abnormal findings of blood chemistry: Secondary | ICD-10-CM | POA: Diagnosis not present

## 2018-02-10 DIAGNOSIS — K449 Diaphragmatic hernia without obstruction or gangrene: Secondary | ICD-10-CM | POA: Diagnosis not present

## 2018-02-10 DIAGNOSIS — I10 Essential (primary) hypertension: Secondary | ICD-10-CM | POA: Diagnosis not present

## 2018-02-10 DIAGNOSIS — K8020 Calculus of gallbladder without cholecystitis without obstruction: Secondary | ICD-10-CM | POA: Diagnosis not present

## 2018-02-15 ENCOUNTER — Other Ambulatory Visit: Payer: Self-pay | Admitting: Internal Medicine

## 2018-02-15 DIAGNOSIS — Z1231 Encounter for screening mammogram for malignant neoplasm of breast: Secondary | ICD-10-CM

## 2018-02-18 DIAGNOSIS — D751 Secondary polycythemia: Secondary | ICD-10-CM | POA: Diagnosis not present

## 2018-02-18 DIAGNOSIS — F319 Bipolar disorder, unspecified: Secondary | ICD-10-CM | POA: Diagnosis not present

## 2018-02-18 DIAGNOSIS — E118 Type 2 diabetes mellitus with unspecified complications: Secondary | ICD-10-CM | POA: Diagnosis not present

## 2018-02-18 DIAGNOSIS — J4540 Moderate persistent asthma, uncomplicated: Secondary | ICD-10-CM | POA: Diagnosis not present

## 2018-02-18 DIAGNOSIS — I10 Essential (primary) hypertension: Secondary | ICD-10-CM | POA: Diagnosis not present

## 2018-02-18 DIAGNOSIS — E559 Vitamin D deficiency, unspecified: Secondary | ICD-10-CM | POA: Diagnosis not present

## 2018-02-18 DIAGNOSIS — M5136 Other intervertebral disc degeneration, lumbar region: Secondary | ICD-10-CM | POA: Diagnosis not present

## 2018-03-08 ENCOUNTER — Other Ambulatory Visit (INDEPENDENT_AMBULATORY_CARE_PROVIDER_SITE_OTHER): Payer: Self-pay | Admitting: Internal Medicine

## 2018-03-08 DIAGNOSIS — I10 Essential (primary) hypertension: Secondary | ICD-10-CM

## 2018-03-09 ENCOUNTER — Ambulatory Visit (INDEPENDENT_AMBULATORY_CARE_PROVIDER_SITE_OTHER): Payer: Medicare HMO

## 2018-03-09 DIAGNOSIS — I10 Essential (primary) hypertension: Secondary | ICD-10-CM | POA: Diagnosis not present

## 2018-03-17 DIAGNOSIS — M5136 Other intervertebral disc degeneration, lumbar region: Secondary | ICD-10-CM | POA: Diagnosis not present

## 2018-03-17 DIAGNOSIS — E118 Type 2 diabetes mellitus with unspecified complications: Secondary | ICD-10-CM | POA: Diagnosis not present

## 2018-03-17 DIAGNOSIS — E559 Vitamin D deficiency, unspecified: Secondary | ICD-10-CM | POA: Diagnosis not present

## 2018-03-17 DIAGNOSIS — I10 Essential (primary) hypertension: Secondary | ICD-10-CM | POA: Diagnosis not present

## 2018-03-17 DIAGNOSIS — D751 Secondary polycythemia: Secondary | ICD-10-CM | POA: Diagnosis not present

## 2018-03-17 DIAGNOSIS — F319 Bipolar disorder, unspecified: Secondary | ICD-10-CM | POA: Diagnosis not present

## 2018-03-17 DIAGNOSIS — J4540 Moderate persistent asthma, uncomplicated: Secondary | ICD-10-CM | POA: Diagnosis not present

## 2018-03-31 ENCOUNTER — Encounter: Payer: Self-pay | Admitting: Radiology

## 2018-03-31 ENCOUNTER — Ambulatory Visit
Admission: RE | Admit: 2018-03-31 | Discharge: 2018-03-31 | Disposition: A | Discharge status: Home / Self Care | Payer: Medicare HMO | Source: Ambulatory Visit | Attending: Internal Medicine | Admitting: Internal Medicine

## 2018-03-31 DIAGNOSIS — Z1231 Encounter for screening mammogram for malignant neoplasm of breast: Secondary | ICD-10-CM

## 2018-05-26 DIAGNOSIS — J069 Acute upper respiratory infection, unspecified: Secondary | ICD-10-CM | POA: Diagnosis not present

## 2018-08-09 DIAGNOSIS — D751 Secondary polycythemia: Secondary | ICD-10-CM | POA: Diagnosis not present

## 2018-08-09 DIAGNOSIS — E118 Type 2 diabetes mellitus with unspecified complications: Secondary | ICD-10-CM | POA: Diagnosis not present

## 2018-08-09 DIAGNOSIS — E559 Vitamin D deficiency, unspecified: Secondary | ICD-10-CM | POA: Diagnosis not present

## 2018-08-09 DIAGNOSIS — I10 Essential (primary) hypertension: Secondary | ICD-10-CM | POA: Diagnosis not present

## 2018-08-16 DIAGNOSIS — F319 Bipolar disorder, unspecified: Secondary | ICD-10-CM | POA: Diagnosis not present

## 2018-08-16 DIAGNOSIS — Z124 Encounter for screening for malignant neoplasm of cervix: Secondary | ICD-10-CM | POA: Diagnosis not present

## 2018-08-16 DIAGNOSIS — Z0000 Encounter for general adult medical examination without abnormal findings: Secondary | ICD-10-CM | POA: Diagnosis not present

## 2018-08-16 DIAGNOSIS — E559 Vitamin D deficiency, unspecified: Secondary | ICD-10-CM | POA: Diagnosis not present

## 2018-08-16 DIAGNOSIS — I10 Essential (primary) hypertension: Secondary | ICD-10-CM | POA: Diagnosis not present

## 2018-08-16 DIAGNOSIS — J4540 Moderate persistent asthma, uncomplicated: Secondary | ICD-10-CM | POA: Diagnosis not present

## 2018-08-16 DIAGNOSIS — Z23 Encounter for immunization: Secondary | ICD-10-CM | POA: Diagnosis not present

## 2018-08-16 DIAGNOSIS — M5136 Other intervertebral disc degeneration, lumbar region: Secondary | ICD-10-CM | POA: Diagnosis not present

## 2018-08-16 DIAGNOSIS — E118 Type 2 diabetes mellitus with unspecified complications: Secondary | ICD-10-CM | POA: Diagnosis not present

## 2018-10-07 ENCOUNTER — Other Ambulatory Visit: Payer: Self-pay

## 2018-10-07 DIAGNOSIS — R6889 Other general symptoms and signs: Secondary | ICD-10-CM | POA: Diagnosis not present

## 2018-10-07 DIAGNOSIS — Z20822 Contact with and (suspected) exposure to covid-19: Secondary | ICD-10-CM

## 2018-10-08 LAB — NOVEL CORONAVIRUS, NAA: SARS-CoV-2, NAA: NOT DETECTED

## 2018-11-29 DIAGNOSIS — R109 Unspecified abdominal pain: Secondary | ICD-10-CM | POA: Diagnosis not present

## 2018-11-29 DIAGNOSIS — Z87891 Personal history of nicotine dependence: Secondary | ICD-10-CM | POA: Diagnosis not present

## 2018-11-29 DIAGNOSIS — R3915 Urgency of urination: Secondary | ICD-10-CM | POA: Diagnosis not present

## 2018-11-29 DIAGNOSIS — I10 Essential (primary) hypertension: Secondary | ICD-10-CM | POA: Diagnosis not present

## 2018-12-06 DIAGNOSIS — R0789 Other chest pain: Secondary | ICD-10-CM | POA: Diagnosis not present

## 2018-12-06 DIAGNOSIS — M8580 Other specified disorders of bone density and structure, unspecified site: Secondary | ICD-10-CM | POA: Diagnosis not present

## 2018-12-06 DIAGNOSIS — M5136 Other intervertebral disc degeneration, lumbar region: Secondary | ICD-10-CM | POA: Diagnosis not present

## 2018-12-06 DIAGNOSIS — S2241XA Multiple fractures of ribs, right side, initial encounter for closed fracture: Secondary | ICD-10-CM | POA: Diagnosis not present

## 2018-12-06 DIAGNOSIS — J449 Chronic obstructive pulmonary disease, unspecified: Secondary | ICD-10-CM | POA: Diagnosis not present

## 2018-12-06 DIAGNOSIS — F319 Bipolar disorder, unspecified: Secondary | ICD-10-CM | POA: Diagnosis not present

## 2018-12-06 DIAGNOSIS — E559 Vitamin D deficiency, unspecified: Secondary | ICD-10-CM | POA: Diagnosis not present

## 2018-12-06 DIAGNOSIS — I10 Essential (primary) hypertension: Secondary | ICD-10-CM | POA: Diagnosis not present

## 2018-12-06 DIAGNOSIS — D751 Secondary polycythemia: Secondary | ICD-10-CM | POA: Diagnosis not present

## 2018-12-06 DIAGNOSIS — E119 Type 2 diabetes mellitus without complications: Secondary | ICD-10-CM | POA: Diagnosis not present

## 2019-02-09 DIAGNOSIS — E118 Type 2 diabetes mellitus with unspecified complications: Secondary | ICD-10-CM | POA: Diagnosis not present

## 2019-02-09 DIAGNOSIS — I10 Essential (primary) hypertension: Secondary | ICD-10-CM | POA: Diagnosis not present

## 2019-02-09 DIAGNOSIS — D582 Other hemoglobinopathies: Secondary | ICD-10-CM | POA: Diagnosis not present

## 2019-02-09 DIAGNOSIS — Z1159 Encounter for screening for other viral diseases: Secondary | ICD-10-CM | POA: Diagnosis not present

## 2019-02-16 DIAGNOSIS — E118 Type 2 diabetes mellitus with unspecified complications: Secondary | ICD-10-CM | POA: Diagnosis not present

## 2019-02-16 DIAGNOSIS — M8580 Other specified disorders of bone density and structure, unspecified site: Secondary | ICD-10-CM | POA: Diagnosis not present

## 2019-02-16 DIAGNOSIS — Z87891 Personal history of nicotine dependence: Secondary | ICD-10-CM | POA: Diagnosis not present

## 2019-02-16 DIAGNOSIS — F319 Bipolar disorder, unspecified: Secondary | ICD-10-CM | POA: Diagnosis not present

## 2019-02-16 DIAGNOSIS — E559 Vitamin D deficiency, unspecified: Secondary | ICD-10-CM | POA: Diagnosis not present

## 2019-02-16 DIAGNOSIS — J449 Chronic obstructive pulmonary disease, unspecified: Secondary | ICD-10-CM | POA: Diagnosis not present

## 2019-02-16 DIAGNOSIS — R002 Palpitations: Secondary | ICD-10-CM | POA: Diagnosis not present

## 2019-02-16 DIAGNOSIS — R232 Flushing: Secondary | ICD-10-CM | POA: Diagnosis not present

## 2019-02-16 DIAGNOSIS — M5136 Other intervertebral disc degeneration, lumbar region: Secondary | ICD-10-CM | POA: Diagnosis not present

## 2019-03-14 DIAGNOSIS — R002 Palpitations: Secondary | ICD-10-CM | POA: Diagnosis not present

## 2019-08-10 DIAGNOSIS — D582 Other hemoglobinopathies: Secondary | ICD-10-CM | POA: Diagnosis not present

## 2019-08-10 DIAGNOSIS — E785 Hyperlipidemia, unspecified: Secondary | ICD-10-CM | POA: Diagnosis not present

## 2019-08-10 DIAGNOSIS — E118 Type 2 diabetes mellitus with unspecified complications: Secondary | ICD-10-CM | POA: Diagnosis not present

## 2019-08-17 DIAGNOSIS — R102 Pelvic and perineal pain: Secondary | ICD-10-CM | POA: Diagnosis not present

## 2019-08-17 DIAGNOSIS — M5136 Other intervertebral disc degeneration, lumbar region: Secondary | ICD-10-CM | POA: Diagnosis not present

## 2019-08-17 DIAGNOSIS — E119 Type 2 diabetes mellitus without complications: Secondary | ICD-10-CM | POA: Diagnosis not present

## 2019-08-17 DIAGNOSIS — F319 Bipolar disorder, unspecified: Secondary | ICD-10-CM | POA: Diagnosis not present

## 2019-08-17 DIAGNOSIS — I10 Essential (primary) hypertension: Secondary | ICD-10-CM | POA: Diagnosis not present

## 2019-08-17 DIAGNOSIS — J449 Chronic obstructive pulmonary disease, unspecified: Secondary | ICD-10-CM | POA: Diagnosis not present

## 2019-08-17 DIAGNOSIS — E559 Vitamin D deficiency, unspecified: Secondary | ICD-10-CM | POA: Diagnosis not present

## 2019-08-17 DIAGNOSIS — D751 Secondary polycythemia: Secondary | ICD-10-CM | POA: Diagnosis not present

## 2019-08-17 DIAGNOSIS — Z0000 Encounter for general adult medical examination without abnormal findings: Secondary | ICD-10-CM | POA: Diagnosis not present

## 2019-11-14 DIAGNOSIS — J449 Chronic obstructive pulmonary disease, unspecified: Secondary | ICD-10-CM | POA: Diagnosis not present

## 2019-11-14 DIAGNOSIS — I10 Essential (primary) hypertension: Secondary | ICD-10-CM | POA: Diagnosis not present

## 2019-11-14 DIAGNOSIS — Z1211 Encounter for screening for malignant neoplasm of colon: Secondary | ICD-10-CM | POA: Diagnosis not present

## 2019-11-14 DIAGNOSIS — M5136 Other intervertebral disc degeneration, lumbar region: Secondary | ICD-10-CM | POA: Diagnosis not present

## 2019-11-14 DIAGNOSIS — E559 Vitamin D deficiency, unspecified: Secondary | ICD-10-CM | POA: Diagnosis not present

## 2019-11-14 DIAGNOSIS — Z23 Encounter for immunization: Secondary | ICD-10-CM | POA: Diagnosis not present

## 2019-11-14 DIAGNOSIS — E785 Hyperlipidemia, unspecified: Secondary | ICD-10-CM | POA: Diagnosis not present

## 2019-11-14 DIAGNOSIS — E118 Type 2 diabetes mellitus with unspecified complications: Secondary | ICD-10-CM | POA: Diagnosis not present

## 2019-11-14 DIAGNOSIS — F319 Bipolar disorder, unspecified: Secondary | ICD-10-CM | POA: Diagnosis not present

## 2019-11-30 DIAGNOSIS — Z20822 Contact with and (suspected) exposure to covid-19: Secondary | ICD-10-CM | POA: Diagnosis not present

## 2019-12-05 DIAGNOSIS — Z1211 Encounter for screening for malignant neoplasm of colon: Secondary | ICD-10-CM | POA: Diagnosis not present

## 2020-02-06 DIAGNOSIS — D582 Other hemoglobinopathies: Secondary | ICD-10-CM | POA: Diagnosis not present

## 2020-02-06 DIAGNOSIS — E118 Type 2 diabetes mellitus with unspecified complications: Secondary | ICD-10-CM | POA: Diagnosis not present

## 2020-02-06 DIAGNOSIS — E785 Hyperlipidemia, unspecified: Secondary | ICD-10-CM | POA: Diagnosis not present

## 2020-02-22 DIAGNOSIS — F319 Bipolar disorder, unspecified: Secondary | ICD-10-CM | POA: Diagnosis not present

## 2020-02-22 DIAGNOSIS — J4540 Moderate persistent asthma, uncomplicated: Secondary | ICD-10-CM | POA: Diagnosis not present

## 2020-02-22 DIAGNOSIS — D582 Other hemoglobinopathies: Secondary | ICD-10-CM | POA: Diagnosis not present

## 2020-02-22 DIAGNOSIS — M5136 Other intervertebral disc degeneration, lumbar region: Secondary | ICD-10-CM | POA: Diagnosis not present

## 2020-02-22 DIAGNOSIS — J449 Chronic obstructive pulmonary disease, unspecified: Secondary | ICD-10-CM | POA: Diagnosis not present

## 2020-02-22 DIAGNOSIS — I10 Essential (primary) hypertension: Secondary | ICD-10-CM | POA: Diagnosis not present

## 2020-02-22 DIAGNOSIS — E118 Type 2 diabetes mellitus with unspecified complications: Secondary | ICD-10-CM | POA: Diagnosis not present

## 2020-02-22 DIAGNOSIS — E559 Vitamin D deficiency, unspecified: Secondary | ICD-10-CM | POA: Diagnosis not present

## 2020-03-24 DIAGNOSIS — I10 Essential (primary) hypertension: Secondary | ICD-10-CM | POA: Diagnosis not present

## 2020-03-24 DIAGNOSIS — N390 Urinary tract infection, site not specified: Secondary | ICD-10-CM | POA: Diagnosis not present

## 2020-04-03 DIAGNOSIS — J449 Chronic obstructive pulmonary disease, unspecified: Secondary | ICD-10-CM | POA: Diagnosis not present

## 2020-04-03 DIAGNOSIS — F319 Bipolar disorder, unspecified: Secondary | ICD-10-CM | POA: Diagnosis not present

## 2020-04-03 DIAGNOSIS — R3129 Other microscopic hematuria: Secondary | ICD-10-CM | POA: Diagnosis not present

## 2020-04-03 DIAGNOSIS — I10 Essential (primary) hypertension: Secondary | ICD-10-CM | POA: Diagnosis not present

## 2020-04-03 DIAGNOSIS — R300 Dysuria: Secondary | ICD-10-CM | POA: Diagnosis not present

## 2020-04-03 DIAGNOSIS — R1031 Right lower quadrant pain: Secondary | ICD-10-CM | POA: Diagnosis not present

## 2020-04-03 DIAGNOSIS — E118 Type 2 diabetes mellitus with unspecified complications: Secondary | ICD-10-CM | POA: Diagnosis not present

## 2020-04-05 ENCOUNTER — Other Ambulatory Visit (HOSPITAL_COMMUNITY): Payer: Self-pay | Admitting: Internal Medicine

## 2020-04-05 ENCOUNTER — Other Ambulatory Visit: Payer: Self-pay | Admitting: Internal Medicine

## 2020-04-05 DIAGNOSIS — R1031 Right lower quadrant pain: Secondary | ICD-10-CM

## 2020-04-05 DIAGNOSIS — R300 Dysuria: Secondary | ICD-10-CM

## 2020-04-06 ENCOUNTER — Ambulatory Visit
Admission: RE | Admit: 2020-04-06 | Discharge: 2020-04-06 | Disposition: A | Discharge status: Home / Self Care | Payer: Medicare HMO | Source: Ambulatory Visit | Attending: Internal Medicine | Admitting: Internal Medicine

## 2020-04-06 ENCOUNTER — Other Ambulatory Visit: Payer: Self-pay

## 2020-04-06 DIAGNOSIS — R1031 Right lower quadrant pain: Secondary | ICD-10-CM | POA: Diagnosis not present

## 2020-04-06 DIAGNOSIS — R300 Dysuria: Secondary | ICD-10-CM

## 2020-04-06 DIAGNOSIS — K8020 Calculus of gallbladder without cholecystitis without obstruction: Secondary | ICD-10-CM | POA: Diagnosis not present

## 2020-04-06 DIAGNOSIS — K429 Umbilical hernia without obstruction or gangrene: Secondary | ICD-10-CM | POA: Diagnosis not present

## 2020-04-06 DIAGNOSIS — K8080 Other cholelithiasis without obstruction: Secondary | ICD-10-CM | POA: Diagnosis not present

## 2020-04-06 DIAGNOSIS — R110 Nausea: Secondary | ICD-10-CM | POA: Diagnosis not present

## 2020-04-11 ENCOUNTER — Other Ambulatory Visit: Payer: Self-pay | Admitting: Internal Medicine

## 2020-04-11 DIAGNOSIS — K8020 Calculus of gallbladder without cholecystitis without obstruction: Secondary | ICD-10-CM

## 2020-04-11 DIAGNOSIS — R1011 Right upper quadrant pain: Secondary | ICD-10-CM

## 2020-04-17 DIAGNOSIS — K8020 Calculus of gallbladder without cholecystitis without obstruction: Secondary | ICD-10-CM | POA: Diagnosis not present

## 2020-04-17 DIAGNOSIS — I10 Essential (primary) hypertension: Secondary | ICD-10-CM | POA: Diagnosis not present

## 2020-04-17 DIAGNOSIS — E118 Type 2 diabetes mellitus with unspecified complications: Secondary | ICD-10-CM | POA: Diagnosis not present

## 2020-04-17 DIAGNOSIS — M5136 Other intervertebral disc degeneration, lumbar region: Secondary | ICD-10-CM | POA: Diagnosis not present

## 2020-04-17 DIAGNOSIS — J449 Chronic obstructive pulmonary disease, unspecified: Secondary | ICD-10-CM | POA: Diagnosis not present

## 2020-04-17 DIAGNOSIS — F319 Bipolar disorder, unspecified: Secondary | ICD-10-CM | POA: Diagnosis not present

## 2020-05-02 ENCOUNTER — Ambulatory Visit
Admission: RE | Admit: 2020-05-02 | Discharge: 2020-05-02 | Disposition: A | Discharge status: Home / Self Care | Payer: Medicare HMO | Source: Ambulatory Visit | Attending: Internal Medicine | Admitting: Internal Medicine

## 2020-05-02 ENCOUNTER — Other Ambulatory Visit: Payer: Self-pay

## 2020-05-02 DIAGNOSIS — K8020 Calculus of gallbladder without cholecystitis without obstruction: Secondary | ICD-10-CM | POA: Insufficient documentation

## 2020-05-02 DIAGNOSIS — R1011 Right upper quadrant pain: Secondary | ICD-10-CM | POA: Diagnosis not present

## 2020-05-02 MED ORDER — TECHNETIUM TC 99M MEBROFENIN IV KIT
5.0000 | PACK | Freq: Once | INTRAVENOUS | Status: AC | PRN
  Administered 2020-05-02: 5.239 via INTRAVENOUS

## 2020-08-21 DIAGNOSIS — D582 Other hemoglobinopathies: Secondary | ICD-10-CM | POA: Diagnosis not present

## 2020-08-21 DIAGNOSIS — E559 Vitamin D deficiency, unspecified: Secondary | ICD-10-CM | POA: Diagnosis not present

## 2020-08-21 DIAGNOSIS — E118 Type 2 diabetes mellitus with unspecified complications: Secondary | ICD-10-CM | POA: Diagnosis not present

## 2020-08-21 DIAGNOSIS — E785 Hyperlipidemia, unspecified: Secondary | ICD-10-CM | POA: Diagnosis not present

## 2020-08-28 DIAGNOSIS — I10 Essential (primary) hypertension: Secondary | ICD-10-CM | POA: Diagnosis not present

## 2020-08-28 DIAGNOSIS — E559 Vitamin D deficiency, unspecified: Secondary | ICD-10-CM | POA: Diagnosis not present

## 2020-08-28 DIAGNOSIS — F319 Bipolar disorder, unspecified: Secondary | ICD-10-CM | POA: Diagnosis not present

## 2020-08-28 DIAGNOSIS — I700 Atherosclerosis of aorta: Secondary | ICD-10-CM | POA: Diagnosis not present

## 2020-08-28 DIAGNOSIS — Z1211 Encounter for screening for malignant neoplasm of colon: Secondary | ICD-10-CM | POA: Diagnosis not present

## 2020-08-28 DIAGNOSIS — M5136 Other intervertebral disc degeneration, lumbar region: Secondary | ICD-10-CM | POA: Diagnosis not present

## 2020-08-28 DIAGNOSIS — Z1389 Encounter for screening for other disorder: Secondary | ICD-10-CM | POA: Diagnosis not present

## 2020-08-28 DIAGNOSIS — Z0000 Encounter for general adult medical examination without abnormal findings: Secondary | ICD-10-CM | POA: Diagnosis not present

## 2020-08-28 DIAGNOSIS — E785 Hyperlipidemia, unspecified: Secondary | ICD-10-CM | POA: Diagnosis not present

## 2020-08-28 DIAGNOSIS — D582 Other hemoglobinopathies: Secondary | ICD-10-CM | POA: Diagnosis not present

## 2020-08-28 DIAGNOSIS — E119 Type 2 diabetes mellitus without complications: Secondary | ICD-10-CM | POA: Diagnosis not present

## 2021-02-21 DIAGNOSIS — E118 Type 2 diabetes mellitus with unspecified complications: Secondary | ICD-10-CM | POA: Diagnosis not present

## 2021-02-21 DIAGNOSIS — E785 Hyperlipidemia, unspecified: Secondary | ICD-10-CM | POA: Diagnosis not present

## 2021-02-21 DIAGNOSIS — D582 Other hemoglobinopathies: Secondary | ICD-10-CM | POA: Diagnosis not present

## 2021-02-28 DIAGNOSIS — I700 Atherosclerosis of aorta: Secondary | ICD-10-CM | POA: Diagnosis not present

## 2021-02-28 DIAGNOSIS — F319 Bipolar disorder, unspecified: Secondary | ICD-10-CM | POA: Diagnosis not present

## 2021-02-28 DIAGNOSIS — I10 Essential (primary) hypertension: Secondary | ICD-10-CM | POA: Diagnosis not present

## 2021-02-28 DIAGNOSIS — M5136 Other intervertebral disc degeneration, lumbar region: Secondary | ICD-10-CM | POA: Diagnosis not present

## 2021-02-28 DIAGNOSIS — J4540 Moderate persistent asthma, uncomplicated: Secondary | ICD-10-CM | POA: Diagnosis not present

## 2021-02-28 DIAGNOSIS — E559 Vitamin D deficiency, unspecified: Secondary | ICD-10-CM | POA: Diagnosis not present

## 2021-02-28 DIAGNOSIS — E119 Type 2 diabetes mellitus without complications: Secondary | ICD-10-CM | POA: Diagnosis not present

## 2021-02-28 DIAGNOSIS — D582 Other hemoglobinopathies: Secondary | ICD-10-CM | POA: Diagnosis not present

## 2021-02-28 DIAGNOSIS — E785 Hyperlipidemia, unspecified: Secondary | ICD-10-CM | POA: Diagnosis not present

## 2021-03-04 DIAGNOSIS — N3000 Acute cystitis without hematuria: Secondary | ICD-10-CM | POA: Diagnosis not present

## 2021-03-04 DIAGNOSIS — R350 Frequency of micturition: Secondary | ICD-10-CM | POA: Diagnosis not present

## 2021-05-03 DIAGNOSIS — R0602 Shortness of breath: Secondary | ICD-10-CM | POA: Diagnosis not present

## 2021-05-03 DIAGNOSIS — J45909 Unspecified asthma, uncomplicated: Secondary | ICD-10-CM | POA: Diagnosis not present

## 2021-05-03 DIAGNOSIS — R609 Edema, unspecified: Secondary | ICD-10-CM | POA: Diagnosis not present

## 2021-05-06 DIAGNOSIS — I700 Atherosclerosis of aorta: Secondary | ICD-10-CM | POA: Diagnosis not present

## 2021-05-06 DIAGNOSIS — I10 Essential (primary) hypertension: Secondary | ICD-10-CM | POA: Diagnosis not present

## 2021-05-06 DIAGNOSIS — J4541 Moderate persistent asthma with (acute) exacerbation: Secondary | ICD-10-CM | POA: Diagnosis not present

## 2021-05-06 DIAGNOSIS — J449 Chronic obstructive pulmonary disease, unspecified: Secondary | ICD-10-CM | POA: Diagnosis not present

## 2021-05-06 DIAGNOSIS — F319 Bipolar disorder, unspecified: Secondary | ICD-10-CM | POA: Diagnosis not present

## 2021-05-06 DIAGNOSIS — R600 Localized edema: Secondary | ICD-10-CM | POA: Diagnosis not present

## 2021-05-06 DIAGNOSIS — D582 Other hemoglobinopathies: Secondary | ICD-10-CM | POA: Diagnosis not present

## 2021-05-06 DIAGNOSIS — E118 Type 2 diabetes mellitus with unspecified complications: Secondary | ICD-10-CM | POA: Diagnosis not present

## 2021-05-06 DIAGNOSIS — R0602 Shortness of breath: Secondary | ICD-10-CM | POA: Diagnosis not present

## 2021-05-07 ENCOUNTER — Other Ambulatory Visit: Payer: Self-pay | Admitting: Internal Medicine

## 2021-05-07 DIAGNOSIS — R0602 Shortness of breath: Secondary | ICD-10-CM

## 2021-05-07 DIAGNOSIS — R600 Localized edema: Secondary | ICD-10-CM

## 2021-05-20 DIAGNOSIS — D582 Other hemoglobinopathies: Secondary | ICD-10-CM | POA: Diagnosis not present

## 2021-05-20 DIAGNOSIS — F319 Bipolar disorder, unspecified: Secondary | ICD-10-CM | POA: Diagnosis not present

## 2021-05-20 DIAGNOSIS — Z87891 Personal history of nicotine dependence: Secondary | ICD-10-CM | POA: Diagnosis not present

## 2021-05-20 DIAGNOSIS — J449 Chronic obstructive pulmonary disease, unspecified: Secondary | ICD-10-CM | POA: Diagnosis not present

## 2021-05-20 DIAGNOSIS — I10 Essential (primary) hypertension: Secondary | ICD-10-CM | POA: Diagnosis not present

## 2021-05-20 DIAGNOSIS — E118 Type 2 diabetes mellitus with unspecified complications: Secondary | ICD-10-CM | POA: Diagnosis not present

## 2021-05-20 DIAGNOSIS — J4541 Moderate persistent asthma with (acute) exacerbation: Secondary | ICD-10-CM | POA: Diagnosis not present

## 2021-05-20 DIAGNOSIS — I700 Atherosclerosis of aorta: Secondary | ICD-10-CM | POA: Diagnosis not present

## 2021-06-03 DIAGNOSIS — R0609 Other forms of dyspnea: Secondary | ICD-10-CM | POA: Diagnosis not present

## 2021-06-03 DIAGNOSIS — J449 Chronic obstructive pulmonary disease, unspecified: Secondary | ICD-10-CM | POA: Diagnosis not present

## 2021-06-20 ENCOUNTER — Encounter (HOSPITAL_COMMUNITY): Payer: Self-pay | Admitting: Emergency Medicine

## 2021-06-20 ENCOUNTER — Inpatient Hospital Stay (HOSPITAL_COMMUNITY): Payer: Medicare HMO

## 2021-06-20 ENCOUNTER — Inpatient Hospital Stay (HOSPITAL_COMMUNITY)
Admission: EM | Admit: 2021-06-20 | Discharge: 2021-06-27 | DRG: 286 | Disposition: A | Discharge status: Home Health | Payer: Medicare HMO | Attending: Internal Medicine | Admitting: Internal Medicine

## 2021-06-20 ENCOUNTER — Other Ambulatory Visit: Payer: Self-pay

## 2021-06-20 ENCOUNTER — Emergency Department (HOSPITAL_COMMUNITY): Payer: Medicare HMO

## 2021-06-20 ENCOUNTER — Inpatient Hospital Stay (HOSPITAL_COMMUNITY)
Admission: EM | Disposition: A | Discharge status: Home Health | Payer: Self-pay | Source: Home / Self Care | Attending: Internal Medicine

## 2021-06-20 DIAGNOSIS — I513 Intracardiac thrombosis, not elsewhere classified: Secondary | ICD-10-CM | POA: Diagnosis present

## 2021-06-20 DIAGNOSIS — Z781 Physical restraint status: Secondary | ICD-10-CM

## 2021-06-20 DIAGNOSIS — R0689 Other abnormalities of breathing: Secondary | ICD-10-CM | POA: Diagnosis not present

## 2021-06-20 DIAGNOSIS — F17210 Nicotine dependence, cigarettes, uncomplicated: Secondary | ICD-10-CM | POA: Diagnosis present

## 2021-06-20 DIAGNOSIS — I5041 Acute combined systolic (congestive) and diastolic (congestive) heart failure: Secondary | ICD-10-CM | POA: Diagnosis not present

## 2021-06-20 DIAGNOSIS — F419 Anxiety disorder, unspecified: Secondary | ICD-10-CM | POA: Diagnosis not present

## 2021-06-20 DIAGNOSIS — J811 Chronic pulmonary edema: Secondary | ICD-10-CM | POA: Diagnosis not present

## 2021-06-20 DIAGNOSIS — I444 Left anterior fascicular block: Secondary | ICD-10-CM | POA: Diagnosis present

## 2021-06-20 DIAGNOSIS — I509 Heart failure, unspecified: Secondary | ICD-10-CM | POA: Diagnosis not present

## 2021-06-20 DIAGNOSIS — E785 Hyperlipidemia, unspecified: Secondary | ICD-10-CM | POA: Diagnosis present

## 2021-06-20 DIAGNOSIS — R0602 Shortness of breath: Secondary | ICD-10-CM | POA: Diagnosis not present

## 2021-06-20 DIAGNOSIS — R079 Chest pain, unspecified: Secondary | ICD-10-CM | POA: Diagnosis not present

## 2021-06-20 DIAGNOSIS — Z79899 Other long term (current) drug therapy: Secondary | ICD-10-CM | POA: Diagnosis not present

## 2021-06-20 DIAGNOSIS — M549 Dorsalgia, unspecified: Secondary | ICD-10-CM | POA: Diagnosis not present

## 2021-06-20 DIAGNOSIS — T380X5A Adverse effect of glucocorticoids and synthetic analogues, initial encounter: Secondary | ICD-10-CM | POA: Diagnosis present

## 2021-06-20 DIAGNOSIS — I10 Essential (primary) hypertension: Secondary | ICD-10-CM | POA: Diagnosis present

## 2021-06-20 DIAGNOSIS — J9621 Acute and chronic respiratory failure with hypoxia: Secondary | ICD-10-CM | POA: Diagnosis not present

## 2021-06-20 DIAGNOSIS — I517 Cardiomegaly: Secondary | ICD-10-CM | POA: Diagnosis not present

## 2021-06-20 DIAGNOSIS — R231 Pallor: Secondary | ICD-10-CM | POA: Diagnosis not present

## 2021-06-20 DIAGNOSIS — R7989 Other specified abnormal findings of blood chemistry: Secondary | ICD-10-CM | POA: Diagnosis present

## 2021-06-20 DIAGNOSIS — R0902 Hypoxemia: Secondary | ICD-10-CM | POA: Diagnosis not present

## 2021-06-20 DIAGNOSIS — E1165 Type 2 diabetes mellitus with hyperglycemia: Secondary | ICD-10-CM | POA: Diagnosis not present

## 2021-06-20 DIAGNOSIS — J441 Chronic obstructive pulmonary disease with (acute) exacerbation: Secondary | ICD-10-CM | POA: Diagnosis not present

## 2021-06-20 DIAGNOSIS — R6521 Severe sepsis with septic shock: Secondary | ICD-10-CM | POA: Diagnosis not present

## 2021-06-20 DIAGNOSIS — I428 Other cardiomyopathies: Secondary | ICD-10-CM | POA: Diagnosis present

## 2021-06-20 DIAGNOSIS — R6510 Systemic inflammatory response syndrome (SIRS) of non-infectious origin without acute organ dysfunction: Secondary | ICD-10-CM | POA: Diagnosis not present

## 2021-06-20 DIAGNOSIS — F411 Generalized anxiety disorder: Secondary | ICD-10-CM | POA: Diagnosis present

## 2021-06-20 DIAGNOSIS — F05 Delirium due to known physiological condition: Secondary | ICD-10-CM | POA: Diagnosis not present

## 2021-06-20 DIAGNOSIS — F319 Bipolar disorder, unspecified: Secondary | ICD-10-CM | POA: Diagnosis not present

## 2021-06-20 DIAGNOSIS — A419 Sepsis, unspecified organism: Secondary | ICD-10-CM | POA: Diagnosis present

## 2021-06-20 DIAGNOSIS — J9601 Acute respiratory failure with hypoxia: Secondary | ICD-10-CM | POA: Diagnosis not present

## 2021-06-20 DIAGNOSIS — E876 Hypokalemia: Secondary | ICD-10-CM | POA: Diagnosis present

## 2021-06-20 DIAGNOSIS — J690 Pneumonitis due to inhalation of food and vomit: Secondary | ICD-10-CM | POA: Clinically undetermined

## 2021-06-20 DIAGNOSIS — R570 Cardiogenic shock: Secondary | ICD-10-CM | POA: Diagnosis present

## 2021-06-20 DIAGNOSIS — Z6830 Body mass index (BMI) 30.0-30.9, adult: Secondary | ICD-10-CM | POA: Diagnosis not present

## 2021-06-20 DIAGNOSIS — E669 Obesity, unspecified: Secondary | ICD-10-CM | POA: Diagnosis present

## 2021-06-20 DIAGNOSIS — R9431 Abnormal electrocardiogram [ECG] [EKG]: Secondary | ICD-10-CM | POA: Diagnosis not present

## 2021-06-20 DIAGNOSIS — I5043 Acute on chronic combined systolic (congestive) and diastolic (congestive) heart failure: Secondary | ICD-10-CM | POA: Diagnosis present

## 2021-06-20 DIAGNOSIS — J4540 Moderate persistent asthma, uncomplicated: Secondary | ICD-10-CM | POA: Diagnosis not present

## 2021-06-20 DIAGNOSIS — I110 Hypertensive heart disease with heart failure: Secondary | ICD-10-CM | POA: Diagnosis not present

## 2021-06-20 DIAGNOSIS — R778 Other specified abnormalities of plasma proteins: Secondary | ICD-10-CM | POA: Diagnosis not present

## 2021-06-20 DIAGNOSIS — R918 Other nonspecific abnormal finding of lung field: Secondary | ICD-10-CM | POA: Diagnosis not present

## 2021-06-20 HISTORY — PX: ARTERIAL LINE INSERTION: CATH118227

## 2021-06-20 HISTORY — PX: RIGHT/LEFT HEART CATH AND CORONARY ANGIOGRAPHY: CATH118266

## 2021-06-20 LAB — POCT I-STAT EG7
Acid-Base Excess: 6 mmol/L — ABNORMAL HIGH (ref 0.0–2.0)
Bicarbonate: 27.9 mmol/L (ref 20.0–28.0)
Calcium, Ion: 1.24 mmol/L (ref 1.15–1.40)
HCT: 47 % — ABNORMAL HIGH (ref 36.0–46.0)
Hemoglobin: 16 g/dL — ABNORMAL HIGH (ref 12.0–15.0)
O2 Saturation: 70 %
Potassium: 3.3 mmol/L — ABNORMAL LOW (ref 3.5–5.1)
Sodium: 142 mmol/L (ref 135–145)
TCO2: 29 mmol/L (ref 22–32)
pCO2, Ven: 33 mmHg — ABNORMAL LOW (ref 44–60)
pH, Ven: 7.534 — ABNORMAL HIGH (ref 7.25–7.43)
pO2, Ven: 32 mmHg (ref 32–45)

## 2021-06-20 LAB — TROPONIN I (HIGH SENSITIVITY): Troponin I (High Sensitivity): 760 ng/L (ref <18)

## 2021-06-20 LAB — BRAIN NATRIURETIC PEPTIDE: B Natriuretic Peptide: 2324.8 pg/mL — ABNORMAL HIGH (ref 0.0–100.0)

## 2021-06-20 LAB — CBC WITH DIFFERENTIAL/PLATELET
Abs Immature Granulocytes: 0.24 10*3/uL — ABNORMAL HIGH (ref 0.00–0.07)
Basophils Absolute: 0.1 10*3/uL (ref 0.0–0.1)
Basophils Relative: 0 %
Eosinophils Absolute: 0 10*3/uL (ref 0.0–0.5)
Eosinophils Relative: 0 %
HCT: 52.1 % — ABNORMAL HIGH (ref 36.0–46.0)
Hemoglobin: 16.2 g/dL — ABNORMAL HIGH (ref 12.0–15.0)
Immature Granulocytes: 1 %
Lymphocytes Relative: 6 %
Lymphs Abs: 1.4 10*3/uL (ref 0.7–4.0)
MCH: 30.4 pg (ref 26.0–34.0)
MCHC: 31.1 g/dL (ref 30.0–36.0)
MCV: 97.7 fL (ref 80.0–100.0)
Monocytes Absolute: 2.3 10*3/uL — ABNORMAL HIGH (ref 0.1–1.0)
Monocytes Relative: 10 %
Neutro Abs: 20.4 10*3/uL — ABNORMAL HIGH (ref 1.7–7.7)
Neutrophils Relative %: 83 %
Platelets: 247 10*3/uL (ref 150–400)
RBC: 5.33 MIL/uL — ABNORMAL HIGH (ref 3.87–5.11)
RDW: 13.6 % (ref 11.5–15.5)
WBC: 24.4 10*3/uL — ABNORMAL HIGH (ref 4.0–10.5)
nRBC: 0 % (ref 0.0–0.2)

## 2021-06-20 LAB — ECHOCARDIOGRAM COMPLETE
Area-P 1/2: 7.29 cm2
Calc EF: 18.3 %
Height: 69 in
S' Lateral: 3.5 cm
Single Plane A2C EF: 21.3 %
Single Plane A4C EF: 12.1 %
Weight: 3312.19 oz

## 2021-06-20 LAB — POCT I-STAT 7, (LYTES, BLD GAS, ICA,H+H)
Acid-Base Excess: 5 mmol/L — ABNORMAL HIGH (ref 0.0–2.0)
Acid-Base Excess: 6 mmol/L — ABNORMAL HIGH (ref 0.0–2.0)
Bicarbonate: 25.9 mmol/L (ref 20.0–28.0)
Bicarbonate: 31.3 mmol/L — ABNORMAL HIGH (ref 20.0–28.0)
Calcium, Ion: 1.21 mmol/L (ref 1.15–1.40)
Calcium, Ion: 1.27 mmol/L (ref 1.15–1.40)
HCT: 45 % (ref 36.0–46.0)
HCT: 47 % — ABNORMAL HIGH (ref 36.0–46.0)
Hemoglobin: 15.3 g/dL — ABNORMAL HIGH (ref 12.0–15.0)
Hemoglobin: 16 g/dL — ABNORMAL HIGH (ref 12.0–15.0)
O2 Saturation: 95 %
O2 Saturation: 99 %
Patient temperature: 37.4
Potassium: 3.3 mmol/L — ABNORMAL LOW (ref 3.5–5.1)
Potassium: 3.4 mmol/L — ABNORMAL LOW (ref 3.5–5.1)
Sodium: 140 mmol/L (ref 135–145)
Sodium: 142 mmol/L (ref 135–145)
TCO2: 27 mmol/L (ref 22–32)
TCO2: 33 mmol/L — ABNORMAL HIGH (ref 22–32)
pCO2 arterial: 28.7 mmHg — ABNORMAL LOW (ref 32–48)
pCO2 arterial: 46.4 mmHg (ref 32–48)
pH, Arterial: 7.438 (ref 7.35–7.45)
pH, Arterial: 7.565 — ABNORMAL HIGH (ref 7.35–7.45)
pO2, Arterial: 158 mmHg — ABNORMAL HIGH (ref 83–108)
pO2, Arterial: 65 mmHg — ABNORMAL LOW (ref 83–108)

## 2021-06-20 LAB — I-STAT VENOUS BLOOD GAS, ED
Acid-Base Excess: 5 mmol/L — ABNORMAL HIGH (ref 0.0–2.0)
Bicarbonate: 32.4 mmol/L — ABNORMAL HIGH (ref 20.0–28.0)
Calcium, Ion: 1.23 mmol/L (ref 1.15–1.40)
HCT: 50 % — ABNORMAL HIGH (ref 36.0–46.0)
Hemoglobin: 17 g/dL — ABNORMAL HIGH (ref 12.0–15.0)
O2 Saturation: 60 %
Potassium: 4.3 mmol/L (ref 3.5–5.1)
Sodium: 141 mmol/L (ref 135–145)
TCO2: 34 mmol/L — ABNORMAL HIGH (ref 22–32)
pCO2, Ven: 54.5 mmHg (ref 44–60)
pH, Ven: 7.383 (ref 7.25–7.43)
pO2, Ven: 32 mmHg (ref 32–45)

## 2021-06-20 LAB — HEPATIC FUNCTION PANEL
ALT: 49 U/L — ABNORMAL HIGH (ref 0–44)
AST: 41 U/L (ref 15–41)
Albumin: 3.4 g/dL — ABNORMAL LOW (ref 3.5–5.0)
Alkaline Phosphatase: 85 U/L (ref 38–126)
Bilirubin, Direct: 0.2 mg/dL (ref 0.0–0.2)
Indirect Bilirubin: 0.4 mg/dL (ref 0.3–0.9)
Total Bilirubin: 0.6 mg/dL (ref 0.3–1.2)
Total Protein: 6.8 g/dL (ref 6.5–8.1)

## 2021-06-20 LAB — LIPID PANEL
Cholesterol: 218 mg/dL — ABNORMAL HIGH (ref 0–200)
HDL: 55 mg/dL (ref >40)
LDL Cholesterol: 135 mg/dL — ABNORMAL HIGH (ref 0–99)
Total CHOL/HDL Ratio: 4 RATIO
Triglycerides: 142 mg/dL (ref <150)
VLDL: 28 mg/dL (ref 0–40)

## 2021-06-20 LAB — BASIC METABOLIC PANEL
Anion gap: 9 (ref 5–15)
BUN: 26 mg/dL — ABNORMAL HIGH (ref 6–20)
CO2: 27 mmol/L (ref 22–32)
Calcium: 10.4 mg/dL — ABNORMAL HIGH (ref 8.9–10.3)
Chloride: 106 mmol/L (ref 98–111)
Creatinine, Ser: 0.89 mg/dL (ref 0.44–1.00)
GFR, Estimated: >60 mL/min (ref >60)
Glucose, Bld: 156 mg/dL — ABNORMAL HIGH (ref 70–99)
Potassium: 4.2 mmol/L (ref 3.5–5.1)
Sodium: 142 mmol/L (ref 135–145)

## 2021-06-20 LAB — COOXEMETRY PANEL
Carboxyhemoglobin: 1.7 % — ABNORMAL HIGH (ref 0.5–1.5)
Methemoglobin: <0.7 % (ref 0.0–1.5)
O2 Saturation: 83.9 %
Total hemoglobin: 15.1 g/dL (ref 12.0–16.0)

## 2021-06-20 LAB — HIV ANTIBODY (ROUTINE TESTING W REFLEX): HIV Screen 4th Generation wRfx: NONREACTIVE

## 2021-06-20 LAB — TSH: TSH: 0.318 u[IU]/mL — ABNORMAL LOW (ref 0.350–4.500)

## 2021-06-20 LAB — PROCALCITONIN: Procalcitonin: 0.51 ng/mL

## 2021-06-20 SURGERY — RIGHT/LEFT HEART CATH AND CORONARY ANGIOGRAPHY
Anesthesia: LOCAL

## 2021-06-20 MED ORDER — HEPARIN (PORCINE) 25000 UT/250ML-% IV SOLN
1500.0000 [IU]/h | INTRAVENOUS | Status: DC
  Administered 2021-06-20: 1300 [IU]/h via INTRAVENOUS
  Administered 2021-06-21 – 2021-06-22 (×2): 1500 [IU]/h via INTRAVENOUS
  Filled 2021-06-20 (×3): qty 250

## 2021-06-20 MED ORDER — BUSPIRONE HCL 10 MG PO TABS
10.0000 mg | ORAL_TABLET | Freq: Two times a day (BID) | ORAL | Status: DC
  Administered 2021-06-20 – 2021-06-27 (×13): 10 mg via ORAL
  Filled 2021-06-20 (×14): qty 1

## 2021-06-20 MED ORDER — FUROSEMIDE 10 MG/ML IJ SOLN
40.0000 mg | Freq: Two times a day (BID) | INTRAMUSCULAR | Status: DC
  Administered 2021-06-21 (×2): 40 mg via INTRAVENOUS
  Filled 2021-06-20 (×2): qty 4

## 2021-06-20 MED ORDER — REVEFENACIN 175 MCG/3ML IN SOLN
175.0000 ug | Freq: Every day | RESPIRATORY_TRACT | Status: DC
  Administered 2021-06-20 – 2021-06-27 (×7): 175 ug via RESPIRATORY_TRACT
  Filled 2021-06-20 (×8): qty 3

## 2021-06-20 MED ORDER — HEPARIN BOLUS VIA INFUSION
4000.0000 [IU] | Freq: Once | INTRAVENOUS | Status: AC
  Administered 2021-06-20: 4000 [IU] via INTRAVENOUS
  Filled 2021-06-20: qty 4000

## 2021-06-20 MED ORDER — FUROSEMIDE 10 MG/ML IJ SOLN
INTRAMUSCULAR | Status: AC
  Filled 2021-06-20: qty 4

## 2021-06-20 MED ORDER — ARFORMOTEROL TARTRATE 15 MCG/2ML IN NEBU
15.0000 ug | INHALATION_SOLUTION | Freq: Two times a day (BID) | RESPIRATORY_TRACT | Status: DC
  Administered 2021-06-20 – 2021-06-27 (×12): 15 ug via RESPIRATORY_TRACT
  Filled 2021-06-20 (×14): qty 2

## 2021-06-20 MED ORDER — LORAZEPAM 2 MG/ML IJ SOLN: 0.2500 mg | Freq: Four times a day (QID) | INTRAMUSCULAR | Status: DC | PRN

## 2021-06-20 MED ORDER — PERFLUTREN LIPID MICROSPHERE
1.0000 mL | INTRAVENOUS | Status: AC | PRN
  Administered 2021-06-20: 2 mL via INTRAVENOUS

## 2021-06-20 MED ORDER — IOHEXOL 350 MG/ML SOLN
INTRAVENOUS | Status: DC | PRN
  Administered 2021-06-20: 50 mL

## 2021-06-20 MED ORDER — FENTANYL CITRATE (PF) 100 MCG/2ML IJ SOLN
INTRAMUSCULAR | Status: AC
  Filled 2021-06-20: qty 2

## 2021-06-20 MED ORDER — HEPARIN (PORCINE) 25000 UT/250ML-% IV SOLN
1100.0000 [IU]/h | INTRAVENOUS | Status: DC
  Administered 2021-06-20: 1100 [IU]/h via INTRAVENOUS
  Filled 2021-06-20: qty 250

## 2021-06-20 MED ORDER — VERAPAMIL HCL 2.5 MG/ML IV SOLN
INTRAVENOUS | Status: AC
  Filled 2021-06-20: qty 2

## 2021-06-20 MED ORDER — HYDRALAZINE HCL 20 MG/ML IJ SOLN: 10.0000 mg | INTRAMUSCULAR | Status: AC | PRN

## 2021-06-20 MED ORDER — SODIUM CHLORIDE 0.9% IV SOLUTION: INTRAVENOUS | Status: DC

## 2021-06-20 MED ORDER — MILRINONE LOAD VIA INFUSION
INTRAVENOUS | Status: DC | PRN
  Administered 2021-06-20: 11.7375 ug via INTRAVENOUS

## 2021-06-20 MED ORDER — LORAZEPAM 2 MG/ML IJ SOLN
1.0000 mg | INTRAMUSCULAR | Status: DC | PRN
Start: 2021-06-20 — End: 2021-06-25
  Administered 2021-06-20 – 2021-06-23 (×8): 1 mg via INTRAVENOUS
  Filled 2021-06-20 (×9): qty 1

## 2021-06-20 MED ORDER — ALBUTEROL SULFATE (2.5 MG/3ML) 0.083% IN NEBU: 5.0000 mg/h | INHALATION_SOLUTION | RESPIRATORY_TRACT | Status: DC

## 2021-06-20 MED ORDER — SODIUM CHLORIDE 0.9% FLUSH
3.0000 mL | Freq: Two times a day (BID) | INTRAVENOUS | Status: DC
  Administered 2021-06-20 – 2021-06-27 (×11): 3 mL via INTRAVENOUS

## 2021-06-20 MED ORDER — ALBUTEROL SULFATE (2.5 MG/3ML) 0.083% IN NEBU
2.5000 mg | INHALATION_SOLUTION | Freq: Four times a day (QID) | RESPIRATORY_TRACT | Status: DC
  Administered 2021-06-20: 2.5 mg via RESPIRATORY_TRACT
  Filled 2021-06-20: qty 3

## 2021-06-20 MED ORDER — LIDOCAINE HCL (PF) 1 % IJ SOLN
INTRAMUSCULAR | Status: DC | PRN
  Administered 2021-06-20 (×2): 2 mL

## 2021-06-20 MED ORDER — METHYLPREDNISOLONE SODIUM SUCC 125 MG IJ SOLR
80.0000 mg | Freq: Two times a day (BID) | INTRAMUSCULAR | Status: DC
  Administered 2021-06-20: 80 mg via INTRAVENOUS
  Filled 2021-06-20: qty 2

## 2021-06-20 MED ORDER — METHYLPREDNISOLONE SODIUM SUCC 125 MG IJ SOLR
125.0000 mg | Freq: Once | INTRAMUSCULAR | Status: AC
  Administered 2021-06-20: 125 mg via INTRAVENOUS
  Filled 2021-06-20: qty 2

## 2021-06-20 MED ORDER — UMECLIDINIUM BROMIDE 62.5 MCG/ACT IN AEPB
1.0000 | INHALATION_SPRAY | Freq: Every day | RESPIRATORY_TRACT | Status: DC
Start: 2021-06-20 — End: 2021-06-20

## 2021-06-20 MED ORDER — HEPARIN (PORCINE) IN NACL 1000-0.9 UT/500ML-% IV SOLN
INTRAVENOUS | Status: DC | PRN
  Administered 2021-06-20 (×2): 500 mL

## 2021-06-20 MED ORDER — FUROSEMIDE 10 MG/ML IJ SOLN
20.0000 mg | Freq: Once | INTRAMUSCULAR | Status: AC
  Administered 2021-06-20: 20 mg via INTRAVENOUS
  Filled 2021-06-20: qty 2

## 2021-06-20 MED ORDER — IPRATROPIUM-ALBUTEROL 0.5-2.5 (3) MG/3ML IN SOLN
3.0000 mL | RESPIRATORY_TRACT | Status: AC
  Administered 2021-06-20 (×3): 3 mL via RESPIRATORY_TRACT
  Filled 2021-06-20: qty 9

## 2021-06-20 MED ORDER — SODIUM CHLORIDE 0.9 % IV SOLN: INTRAVENOUS | Status: DC

## 2021-06-20 MED ORDER — BUDESONIDE 0.5 MG/2ML IN SUSP
0.5000 mg | Freq: Two times a day (BID) | RESPIRATORY_TRACT | Status: DC
  Administered 2021-06-20 – 2021-06-27 (×12): 0.5 mg via RESPIRATORY_TRACT
  Filled 2021-06-20 (×14): qty 2

## 2021-06-20 MED ORDER — LABETALOL HCL 5 MG/ML IV SOLN: 10.0000 mg | INTRAVENOUS | Status: AC | PRN

## 2021-06-20 MED ORDER — HEPARIN SODIUM (PORCINE) 1000 UNIT/ML IJ SOLN
INTRAMUSCULAR | Status: AC
  Filled 2021-06-20: qty 10

## 2021-06-20 MED ORDER — ACETAMINOPHEN 650 MG RE SUPP: 650.0000 mg | Freq: Four times a day (QID) | RECTAL | Status: DC | PRN

## 2021-06-20 MED ORDER — ACETAMINOPHEN 325 MG PO TABS
650.0000 mg | ORAL_TABLET | ORAL | Status: DC | PRN
  Administered 2021-06-21 – 2021-06-26 (×2): 650 mg via ORAL
  Filled 2021-06-20 (×2): qty 2

## 2021-06-20 MED ORDER — ONDANSETRON HCL 4 MG/2ML IJ SOLN: 4.0000 mg | Freq: Four times a day (QID) | INTRAMUSCULAR | Status: DC | PRN

## 2021-06-20 MED ORDER — LORAZEPAM 2 MG/ML IJ SOLN
0.5000 mg | Freq: Once | INTRAMUSCULAR | Status: AC
  Administered 2021-06-20: 0.5 mg via INTRAVENOUS
  Filled 2021-06-20: qty 1

## 2021-06-20 MED ORDER — HEPARIN (PORCINE) IN NACL 1000-0.9 UT/500ML-% IV SOLN
INTRAVENOUS | Status: AC
  Filled 2021-06-20: qty 1000

## 2021-06-20 MED ORDER — MIDAZOLAM HCL 2 MG/2ML IJ SOLN
INTRAMUSCULAR | Status: DC | PRN
  Administered 2021-06-20: .5 mg via INTRAVENOUS

## 2021-06-20 MED ORDER — CHLORHEXIDINE GLUCONATE CLOTH 2 % EX PADS
6.0000 | MEDICATED_PAD | Freq: Every day | CUTANEOUS | Status: DC
  Administered 2021-06-20 – 2021-06-27 (×7): 6 via TOPICAL

## 2021-06-20 MED ORDER — IPRATROPIUM-ALBUTEROL 0.5-2.5 (3) MG/3ML IN SOLN: 3.0000 mL | Freq: Four times a day (QID) | RESPIRATORY_TRACT | Status: DC | PRN

## 2021-06-20 MED ORDER — FUROSEMIDE 10 MG/ML IJ SOLN
INTRAMUSCULAR | Status: DC | PRN
  Administered 2021-06-20: 80 mg via INTRAVENOUS

## 2021-06-20 MED ORDER — SODIUM CHLORIDE 0.9% IV SOLUTION: INTRAVENOUS | Status: DC | PRN

## 2021-06-20 MED ORDER — SODIUM CHLORIDE 0.9 % IV SOLN
500.0000 mg | INTRAVENOUS | Status: DC
  Administered 2021-06-20: 500 mg via INTRAVENOUS
  Filled 2021-06-20 (×2): qty 5

## 2021-06-20 MED ORDER — SODIUM CHLORIDE 0.9% FLUSH
3.0000 mL | Freq: Two times a day (BID) | INTRAVENOUS | Status: DC
  Administered 2021-06-20 – 2021-06-25 (×10): 3 mL via INTRAVENOUS

## 2021-06-20 MED ORDER — SODIUM CHLORIDE 0.9 % IV SOLN
250.0000 mL | INTRAVENOUS | Status: DC | PRN
  Administered 2021-06-24: 250 mL via INTRAVENOUS

## 2021-06-20 MED ORDER — ALBUTEROL SULFATE (2.5 MG/3ML) 0.083% IN NEBU
INHALATION_SOLUTION | RESPIRATORY_TRACT | Status: AC
  Filled 2021-06-20: qty 15

## 2021-06-20 MED ORDER — SODIUM CHLORIDE 0.9% FLUSH: 3.0000 mL | INTRAVENOUS | Status: DC | PRN

## 2021-06-20 MED ORDER — ALBUTEROL SULFATE (2.5 MG/3ML) 0.083% IN NEBU: 2.5000 mg | INHALATION_SOLUTION | RESPIRATORY_TRACT | Status: DC | PRN

## 2021-06-20 MED ORDER — SODIUM CHLORIDE 0.9 % IV SOLN
2.0000 g | INTRAVENOUS | Status: DC
  Administered 2021-06-20 – 2021-06-21 (×2): 2 g via INTRAVENOUS
  Filled 2021-06-20 (×2): qty 20

## 2021-06-20 MED ORDER — ORAL CARE MOUTH RINSE
15.0000 mL | Freq: Two times a day (BID) | OROMUCOSAL | Status: DC
  Administered 2021-06-20 – 2021-06-27 (×11): 15 mL via OROMUCOSAL

## 2021-06-20 MED ORDER — ACETAMINOPHEN 325 MG PO TABS: 650.0000 mg | ORAL_TABLET | Freq: Four times a day (QID) | ORAL | Status: DC | PRN

## 2021-06-20 MED ORDER — MILRINONE LACTATE IN DEXTROSE 20-5 MG/100ML-% IV SOLN
0.1250 ug/kg/min | INTRAVENOUS | Status: DC
  Administered 2021-06-21: 0.125 ug/kg/min via INTRAVENOUS
  Filled 2021-06-20 (×2): qty 100

## 2021-06-20 MED ORDER — FLUTICASONE FUROATE-VILANTEROL 100-25 MCG/ACT IN AEPB: 1.0000 | INHALATION_SPRAY | Freq: Every day | RESPIRATORY_TRACT | Status: DC

## 2021-06-20 MED ORDER — NOREPINEPHRINE 4 MG/250ML-% IV SOLN
0.0000 ug/min | INTRAVENOUS | Status: DC
  Administered 2021-06-20: 12 ug/min via INTRAVENOUS
  Filled 2021-06-20: qty 250

## 2021-06-20 MED ORDER — MIDAZOLAM HCL 2 MG/2ML IJ SOLN
INTRAMUSCULAR | Status: AC
  Filled 2021-06-20: qty 2

## 2021-06-20 MED ORDER — NOREPINEPHRINE BITARTRATE 1 MG/ML IV SOLN
INTRAVENOUS | Status: AC | PRN
  Administered 2021-06-20: 20 ug/min via INTRAVENOUS

## 2021-06-20 MED ORDER — MAGNESIUM SULFATE 2 GM/50ML IV SOLN
2.0000 g | Freq: Once | INTRAVENOUS | Status: AC
  Administered 2021-06-20: 2 g via INTRAVENOUS
  Filled 2021-06-20: qty 50

## 2021-06-20 MED ORDER — VERAPAMIL HCL 2.5 MG/ML IV SOLN
INTRAVENOUS | Status: DC | PRN
  Administered 2021-06-20: 10 mL via INTRA_ARTERIAL

## 2021-06-20 MED ORDER — ATORVASTATIN CALCIUM 10 MG PO TABS
10.0000 mg | ORAL_TABLET | Freq: Every day | ORAL | Status: DC
  Administered 2021-06-20 – 2021-06-23 (×4): 10 mg via ORAL
  Filled 2021-06-20 (×5): qty 1

## 2021-06-20 MED ORDER — LIDOCAINE HCL (PF) 1 % IJ SOLN
INTRAMUSCULAR | Status: AC
  Filled 2021-06-20: qty 30

## 2021-06-20 MED ORDER — FENTANYL CITRATE (PF) 100 MCG/2ML IJ SOLN: INTRAMUSCULAR | Status: DC | PRN

## 2021-06-20 SURGICAL SUPPLY — 15 items
CATH 5FR JL3.5 JR4 ANG PIG MP (CATHETERS) ×1 IMPLANT
CATH SWAN GANZ VIP 7.5F (CATHETERS) ×1 IMPLANT
DEVICE RAD TR BAND REGULAR (VASCULAR PRODUCTS) ×1 IMPLANT
ELECT DEFIB PAD ADLT CADENCE (PAD) ×1 IMPLANT
GLIDESHEATH SLEND SS 6F .021 (SHEATH) ×1 IMPLANT
GUIDEWIRE INQWIRE 1.5J.035X260 (WIRE) IMPLANT
INQWIRE 1.5J .035X260CM (WIRE) ×2
MAT PREVALON FULL STRYKER (MISCELLANEOUS) ×1 IMPLANT
PACK CARDIAC CATHETERIZATION (CUSTOM PROCEDURE TRAY) ×2 IMPLANT
SHEATH PINNACLE 8F 10CM (SHEATH) ×1 IMPLANT
SHEATH PROBE COVER 6X72 (BAG) ×1 IMPLANT
SLEEVE REPOSITIONING LENGTH 30 (MISCELLANEOUS) ×1 IMPLANT
TRANSDUCER W/STOPCOCK (MISCELLANEOUS) ×2 IMPLANT
WIRE HI TORQ VERSACORE-J 145CM (WIRE) ×1 IMPLANT
WIRE MICRO SET SILHO 5FR 7 (SHEATH) ×1 IMPLANT

## 2021-06-20 NOTE — ED Notes (Signed)
ED TO INPATIENT HANDOFF REPORT  ED Nurse Name and Phone #: Morrie Sheldon RN [phone]  S Name/Age/Gender Karen Perry 56 y.o. female Room/Bed: 026C/026C  Code Status   Code Status: Full Code  Home/SNF/Other Home Patient oriented to: self, place, time, and situation Is this baseline? Yes   Triage Complete: Triage complete  Chief Complaint Acute respiratory failure with hypoxia [facility]) [J96.01]  Triage Note Pt BIB EMS from home for [facility], states that pt saw her PCP for "not feeling well" and was referred to pulmonologist on Thursday. Pt found to be 85% RA on EMS arrival improved to 100% on 10L NRB. Increased ShOB since Thursday took rescue albuterol inhaler with no improvement. Hx COPD and asthma    Allergies Allergies  Allergen Reactions   Septra [Sulfamethoxazole-Trimethoprim] Hives    Level of Care/Admitting Diagnosis ED Disposition     ED Disposition  Admit   Condition  --   Comment  Hospital Area: [facility] [[id number]]  Level of Care: Progressive [102]  Admit to Progressive based on following criteria: CARDIOVASCULAR & THORACIC of moderate stability with acute coronary syndrome symptoms/low risk myocardial infarction/hypertensive urgency/arrhythmias/heart failure potentially compromising stability and stable post cardiovascular intervention patients.  Admit to Progressive based on following criteria: RESPIRATORY PROBLEMS hypoxemic/hypercapnic respiratory failure that is responsive to NIPPV (BiPAP) or High Flow Nasal Cannula (6-80 lpm). Frequent assessment/intervention, no > Q2 hrs < Q4 hrs, to maintain oxygenation and pulmonary hygiene.  May admit patient to [facility] or [facility] if equivalent level of care is available:: No  Covid Evaluation: Asymptomatic - no recent exposure (last 10 days) testing not required  Diagnosis: Acute respiratory failure with hypoxia [facility]) [[id number]]  Admitting Physician: Clydie Braun [[id number]]  Attending  Physician: Clydie Braun [[id number]]  Estimated length of stay: past midnight tomorrow  Certification:: I certify this patient will need inpatient services for at least 2 midnights          B Medical/Surgery History Past Medical History:  Diagnosis Date   Asthma    Bipolar 1 disorder (HCC)    Diabetes mellitus without complication (HCC)    Hypertension    Seizures (HCC)    Past Surgical History:  Procedure Laterality Date   BACK SURGERY       A IV Location/Drains/Wounds Patient Lines/Drains/Airways Status     Active Line/Drains/Airways     Name Placement date Placement time Site Days   Peripheral IV 06/20/21 20 G Right Antecubital 06/20/21  0930  Antecubital  less than 1            Intake/Output Last 24 hours  Intake/Output Summary (Last 24 hours) at 06/20/2021 1433 Last data filed at 06/20/2021 1117 Gross per 24 hour  Intake 38.86 ml  Output --  Net 38.86 ml    Labs/Imaging Results for orders placed or performed during the hospital encounter of 06/20/21 (from the past 48 hour(s))  CBC with Differential     Status: Abnormal   Collection Time: 06/20/21  9:20 AM  Result Value Ref Range   WBC 24.4 (H) 4.0 - 10.5 K/uL   RBC 5.33 (H) 3.87 - 5.11 MIL/uL   Hemoglobin 16.2 (H) 12.0 - 15.0 g/dL   HCT 38.7 (H) 56.4 - 33.2 %   MCV 97.7 80.0 - 100.0 fL   MCH 30.4 26.0 - 34.0 pg   MCHC 31.1 30.0 - 36.0 g/dL   RDW 95.1 88.4 - 16.6 %   Platelets 247 150 - 400 K/uL  Comment: REPEATED TO VERIFY   nRBC 0.0 0.0 - 0.2 %   Neutrophils Relative % 83 %   Neutro Abs 20.4 (H) 1.7 - 7.7 K/uL   Lymphocytes Relative 6 %   Lymphs Abs 1.4 0.7 - 4.0 K/uL   Monocytes Relative 10 %   Monocytes Absolute 2.3 (H) 0.1 - 1.0 K/uL   Eosinophils Relative 0 %   Eosinophils Absolute 0.0 0.0 - 0.5 K/uL   Basophils Relative 0 %   Basophils Absolute 0.1 0.0 - 0.1 K/uL   Immature Granulocytes 1 %   Abs Immature Granulocytes 0.24 (H) 0.00 - 0.07 K/uL    Comment: Performed at [facility], [address] [place], [place] [address]  Basic metabolic panel     Status: Abnormal   Collection Time: 06/20/21  9:20 AM  Result Value Ref Range   Sodium 142 135 - 145 mmol/L   Potassium 4.2 3.5 - 5.1 mmol/L   Chloride 106 98 - 111 mmol/L   CO2 27 22 - 32 mmol/L   Glucose, Bld 156 (H) 70 - 99 mg/dL    Comment: Glucose reference range applies only to samples taken after fasting for at least 8 hours.   BUN 26 (H) 6 - 20 mg/dL   Creatinine, Ser 6.04 0.44 - 1.00 mg/dL   Calcium 54.0 (H) 8.9 - 10.3 mg/dL   GFR, Estimated >98 >11 mL/min    Comment: (NOTE) Calculated using the CKD-EPI Creatinine Equation (2021)    Anion gap 9 5 - 15    Comment: Performed at [facility], [address], [place], [place] [address]  Troponin I (High Sensitivity)     Status: Abnormal   Collection Time: 06/20/21  9:20 AM  Result Value Ref Range   Troponin I (High Sensitivity) 760 (HH) <18 ng/L    Comment: CRITICAL RESULT CALLED TO, READ BACK BY AND VERIFIED WITH: A. Brittanni Cariker RN 06/20/2021 1017 DAVISB (NOTE) Elevated high sensitivity troponin I (hsTnI) values and significant  changes across serial measurements may suggest ACS but many other  chronic and acute conditions are known to elevate hsTnI results.  Refer to the Links section for chest pain algorithms and additional  guidance. Performed at [facility], [address], [place], [place] [address]   Brain natriuretic peptide     Status: Abnormal   Collection Time: 06/20/21  9:20 AM  Result Value Ref Range   B Natriuretic Peptide 2,324.8 (H) 0.0 - 100.0 pg/mL    Comment: Performed at [facility], [address] [place], [place] [address]  I-Stat venous blood gas, ED     Status: Abnormal   Collection Time: 06/20/21  9:28 AM  Result Value Ref Range   pH, Ven 7.383 7.25 - 7.43   pCO2, Ven 54.5 44 - 60 mmHg   pO2, Ven 32 32 - 45 mmHg   Bicarbonate 32.4 (H) 20.0 - 28.0 mmol/L   TCO2 34 (H) 22 - 32 mmol/L   O2 Saturation  60 %   Acid-Base Excess 5.0 (H) 0.0 - 2.0 mmol/L   Sodium 141 135 - 145 mmol/L   Potassium 4.3 3.5 - 5.1 mmol/L   Calcium, Ion 1.23 1.15 - 1.40 mmol/L   HCT 50.0 (H) 36.0 - 46.0 %   Hemoglobin 17.0 (H) 12.0 - 15.0 g/dL   Sample type VENOUS    DG Chest Port 1 View  Result Date: 06/20/2021 CLINICAL DATA:  sob EXAM: PORTABLE CHEST 1  VIEW COMPARISON:  Chest x-ray December 15, 2015. More recent priors are not available. FINDINGS: Diffuse interstitial and patchy bibasilar airspace opacities. No definite pleural effusions. No visible pneumothorax. Cardiomediastinal silhouette is within normal limits. No acute fracture. IMPRESSION: Diffuse interstitial and patchy bibasilar airspace opacities, concerning for edema and/or multifocal pneumonia. Electronically Signed   By: Feliberto Harts M.D.   On: 06/20/2021 09:38    Pending Labs Unresulted Labs (From admission, onward)     Start     Ordered   06/21/21 0500  CBC  Tomorrow morning,   R        06/20/21 1154   06/21/21 0500  Basic metabolic panel  Tomorrow morning,   R        06/20/21 1154   06/21/21 0500  Heparin level (unfractionated)  Daily,   R      06/20/21 1221   06/20/21 1700  Heparin level (unfractionated)  Once-Timed,   TIMED        06/20/21 1221   06/20/21 1201  Expectorated Sputum Assessment w Gram Stain, Rflx to Resp Cult  Once,   R        06/20/21 1200   06/20/21 1159  Procalcitonin - Baseline  ONCE - URGENT,   URGENT        06/20/21 1158   06/20/21 1158  Culture, blood (single) w Reflex to ID Panel  Once,   R        06/20/21 1157   06/20/21 1155  TSH  Add-on,   AD        06/20/21 1154   06/20/21 1154  Hepatic function panel  Add-on,   AD        06/20/21 1154   06/20/21 1154  Lipid panel  Add-on,   AD        06/20/21 1154   06/20/21 1152  HIV Antibody (routine testing w rflx)  (HIV Antibody (Routine testing w reflex) panel)  Once,   R        06/20/21 1154            Vitals/Pain Today's Vitals   06/20/21 1026 06/20/21  1045 06/20/21 1100 06/20/21 1300  BP:  111/85 104/83   Pulse: (!) 123 (!) 121 (!) 121 (!) 123  Resp: (!) 30 (!) 28 (!) 30 16  Temp:      TempSrc:      SpO2: 100% 98% 99% 97%  Weight:      Height:      PainSc:        Isolation Precautions No active isolations  Medications Medications  heparin ADULT infusion 100 units/mL (25000 units/252mL) (1,100 Units/hr Intravenous New Bag/Given 06/20/21 1117)  atorvastatin (LIPITOR) tablet 10 mg (10 mg Oral Given 06/20/21 1300)  sodium chloride flush (NS) 0.9 % injection 3 mL (3 mLs Intravenous Given 06/20/21 1300)  furosemide (LASIX) injection 40 mg (has no administration in time range)  acetaminophen (TYLENOL) tablet 650 mg (has no administration in time range)    Or  acetaminophen (TYLENOL) suppository 650 mg (has no administration in time range)  cefTRIAXone (ROCEPHIN) 2 g in sodium chloride 0.9 % 100 mL IVPB (0 g Intravenous Stopped 06/20/21 1405)  azithromycin (ZITHROMAX) 500 mg in sodium chloride 0.9 % 250 mL IVPB (500 mg Intravenous New Bag/Given 06/20/21 1401)  LORazepam (ATIVAN) injection 0.25 mg (has no administration in time range)  busPIRone (BUSPAR) tablet 10 mg (10 mg Oral Given 06/20/21 1326)  fluticasone furoate-vilanterol (BREO ELLIPTA) 100-25 MCG/ACT 1 puff (  1 puff Inhalation Not Given 06/20/21 1302)    And  umeclidinium bromide (INCRUSE ELLIPTA) 62.5 MCG/ACT 1 puff (1 puff Inhalation Not Given 06/20/21 1302)  albuterol (PROVENTIL) (2.5 MG/3ML) 0.083% nebulizer solution 2.5 mg (has no administration in time range)  albuterol (PROVENTIL) (2.5 MG/3ML) 0.083% nebulizer solution 2.5 mg (has no administration in time range)  ipratropium-albuterol (DUONEB) 0.5-2.5 (3) MG/3ML nebulizer solution 3 mL (3 mLs Nebulization Given 06/20/21 0940)  methylPREDNISolone sodium succinate (SOLU-MEDROL) 125 mg/2 mL injection 125 mg (125 mg Intravenous Given 06/20/21 1017)  magnesium sulfate IVPB 2 g 50 mL (0 g Intravenous Stopped 06/20/21 1117)  furosemide  (LASIX) injection 20 mg (20 mg Intravenous Given 06/20/21 1017)  albuterol (PROVENTIL) (2.5 MG/3ML) 0.083% nebulizer solution (  Given 06/20/21 1025)  heparin bolus via infusion 4,000 Units (4,000 Units Intravenous Bolus from Bag 06/20/21 1117)  furosemide (LASIX) injection 20 mg (20 mg Intravenous Given 06/20/21 1258)  LORazepam (ATIVAN) injection 0.5 mg (0.5 mg Intravenous Given 06/20/21 1259)    Mobility walks with device Moderate fall risk   Focused Assessments Cardiac Assessment Handoff:    Lab Results  Component Value Date   CKTOTAL 359 (H) 02/24/2009   CKMB 3.9 02/22/2009   TROPONINI <0.03 12/13/2014   Lab Results  Component Value Date   DDIMER (H) 02/23/2009    0.61        AT THE INHOUSE ESTABLISHED CUTOFF VALUE OF 0.48 ug/mL FEU, THIS ASSAY HAS BEEN DOCUMENTED IN THE LITERATURE TO HAVE A SENSITIVITY AND NEGATIVE PREDICTIVE VALUE OF AT LEAST 98 TO 99%.  THE TEST RESULT SHOULD BE CORRELATED WITH AN ASSESSMENT OF THE CLINICAL PROBABILITY OF DVT / VTE.   Does the Patient currently have chest pain? No    R Recommendations: See Admitting Provider Note  Report given to:   Additional Notes:

## 2021-06-20 NOTE — Consult Note (Signed)
NAME:  Karen Perry, MRN:  [id number], DOB:  [birth year], LOS: 0 ADMISSION DATE:  06/20/2021, CONSULTATION DATE:  06/20/21 REFERRING MD:  Bensimhon - Adv HF, CHIEF COMPLAINT:  SOB   History of Present Illness:   56 yo F PMH COPD, anxiety, bipolar disorder, HTN, seizures presented to ED 5/18 with SOB and chest tightness. She was placed on Bipap in ED with concern for pulm edema, given steroids, rocephin and azithro for possible AECOPD.   ECHO revealed EF 20-25%-- with concern for possible ischemic disease, taken urgently for cath. Received lasix in ED.   In cath lab, pt on [place]. Developed worsening SOB during case.  PCCM consulted in this setting     Pertinent  Medical History  COPD Bipolar Seizure HTN Anxiety  Significant Hospital Events: Including procedures, antibiotic start and stop dates in addition to other pertinent events   5/18 admit to [facility]. SOB -- concerning for acute systolic HF, pulm edema.  Taken to cath lab for concern for ischemia. Worse resp status, PCCM consult  Interim History / Subjective:  Normal cors   Elevated wedge.   Increasing O2 need. Ventilation ok  Objective   Blood pressure (!) 130/115, pulse (!) 134, temperature 97.6 F (36.4 C), temperature source Oral, resp. rate (!) 38, height 5\' 9"  (1.753 m), weight 93.9 kg, last menstrual period 10/16/2014, SpO2 91 %.    Vent Mode: PCV;BIPAP FiO2 (%):  [40 %] 40 % Set Rate:  [14 bmp] 14 bmp PEEP:  [5 cmH20] 5 cmH20   Intake/Output Summary (Last 24 hours) at 06/20/2021 1825 Last data filed at 06/20/2021 1526 Gross per 24 hour  Intake 290.45 ml  Output --  Net 290.45 ml   Filed Weights   06/20/21 0955  Weight: 93.9 kg    Examination: General: Acutely ill appearing middle aged F supine and in mild distress HENT: NCAT R swan. Pink mm  Lungs: Symmetrical chest expansion. Faint end expiratory wheez. Increased rate  Cardiovascular: tachycardic, s1s2 cap refill 3 sec Abdomen: soft ndnt   Extremities: no acute joint deformity. No pititng extremity edema  Neuro: AAOx3. GU: defer  Resolved Hospital Problem list     Assessment & Plan:   Acute respiratory failure due to pulmonary edema, superimposed on severe newly diagnosed COPD, with possible component of AECOPD  -anxiety also contributing  P -bipap -nebs, steroids  -diurese -NPO  -azithro, rocephin  -close monitoring for possible ETT   Acute systolic heart failure Cardiogenic shock  -normal cors. ?takotsubo  P -Adv HF managing -- changing NE to milrinone  -trend coox   Anxiety Bipolar disorder  P - PRN anxiolysis  -on lamictal outpt -- holding while NPO, restart when appropriate   Leukocytosis  -will send Bcx -sputum cx sent -- if intubated could send trach aspirate vs BAL   Best Practice (right click and "Reselect all SmartList Selections" daily)   Diet/type: NPO DVT prophylaxis: systemic heparin GI prophylaxis: N/A Lines: Central line arterial line  Foley:  N/A Code Status:  full code Last date of multidisciplinary goals of care discussion [-- ]  Labs   CBC: Recent Labs  Lab 06/20/21 0920 06/20/21 0928  WBC 24.4*  --   NEUTROABS 20.4*  --   HGB 16.2* 17.0*  HCT 52.1* 50.0*  MCV 97.7  --   PLT 247  --     Basic Metabolic Panel: Recent Labs  Lab 06/20/21 0920 06/20/21 0928  NA 142 141  K 4.2 4.3  CL 106  --  CO2 27  --   GLUCOSE 156*  --   BUN 26*  --   CREATININE 0.89  --   CALCIUM 10.4*  --    GFR: Estimated Creatinine Clearance: 87.2 mL/min (by C-G formula based on SCr of 0.89 mg/dL). Recent Labs  Lab 06/20/21 0920 06/20/21 1500  PROCALCITON  --  0.51  WBC 24.4*  --     Liver Function Tests: Recent Labs  Lab 06/20/21 1500  AST 41  ALT 49*  ALKPHOS 85  BILITOT 0.6  PROT 6.8  ALBUMIN 3.4*   No results for input(s): LIPASE, AMYLASE in the last 168 hours. No results for input(s): AMMONIA in the last 168 hours.  ABG    Component Value Date/Time    PHART 7.422 (H) 02/20/2009 2215   PCO2ART 31.8 (L) 02/20/2009 2215   PO2ART 121.0 (H) 02/20/2009 2215   HCO3 32.4 (H) 06/20/2021 0928   TCO2 34 (H) 06/20/2021 0928   ACIDBASEDEF 2.8 (H) 02/20/2009 2215   O2SAT 60 06/20/2021 0928     Coagulation Profile: No results for input(s): INR, PROTIME in the last 168 hours.  Cardiac Enzymes: No results for input(s): CKTOTAL, CKMB, CKMBINDEX, TROPONINI in the last 168 hours.  HbA1C: No results found for: HGBA1C  CBG: No results for input(s): GLUCAP in the last 168 hours.  Review of Systems:   Review of Systems  Constitutional: Negative.   HENT: Negative.    Eyes: Negative.   Respiratory:  Positive for shortness of breath and wheezing.   Cardiovascular:  Positive for chest pain.  Gastrointestinal: Negative.   Musculoskeletal: Negative.   Skin: Negative.   Endo/Heme/Allergies: Negative.   Psychiatric/Behavioral:  The patient is nervous/anxious.     Past Medical History:  She,  has a past medical history of Asthma, Bipolar 1 disorder (HCC), Diabetes mellitus without complication (HCC), Hypertension, and Seizures (HCC).   Surgical History:   Past Surgical History:  Procedure Laterality Date   BACK SURGERY       Social History:   reports that she has been smoking cigarettes. She has been smoking an average of .5 packs per day. She does not have any smokeless tobacco history on file. She reports that she does not drink alcohol.   Family History:  Her family history is not on file. She was adopted.   Allergies Allergies  Allergen Reactions   Septra [Sulfamethoxazole-Trimethoprim] Hives     Home Medications  Prior to Admission medications   Medication Sig Start Date End Date Taking? Authorizing Provider  albuterol (PROVENTIL) (2.5 MG/3ML) 0.083% nebulizer solution USE 1 VIAL IN NEBULIZER EVERY 6 HOURS AS NEEDED FOR WHEEZING 06/03/21  Yes [provider]  atorvastatin (LIPITOR) 10 MG tablet Take 10 mg by mouth daily.  05/23/21  Yes [provider]  busPIRone (BUSPAR) 10 MG tablet Take 10 mg by mouth 2 (two) times daily.   Yes [provider]  lamoTRIgine (LAMICTAL) 100 MG tablet Take 200 mg by mouth daily.   Yes [provider]  losartan (COZAAR) 25 MG tablet Take 25 mg by mouth daily. 05/23/21  Yes [provider]  metoprolol tartrate (LOPRESSOR) 25 MG tablet Take 25 mg by mouth daily. 05/23/21  Yes [provider]  montelukast (SINGULAIR) 10 MG tablet Take 10 mg by mouth at bedtime. 05/20/21  Yes [provider]  tiZANidine (ZANAFLEX) 2 MG tablet Take 2 mg by mouth every 8 (eight) hours as needed. 06/06/21  Yes [provider]  Dwyane Luo 100-62.5-25  MCG/ACT AEPB Take 1 puff by mouth daily. 06/03/21  Yes [provider]     Critical care time: 40 minutes     CRITICAL CARE Performed by: Lanier Clam   Total critical care time: 40 minutes  Critical care time was exclusive of separately billable procedures and treating other patients.  Critical care was necessary to treat or prevent imminent or life-threatening deterioration.  Critical care was time spent personally by me on the following activities: development of treatment plan with patient and/or surrogate as well as nursing, discussions with consultants, evaluation of patient's response to treatment, examination of patient, obtaining history from patient or surrogate, ordering and performing treatments and interventions, ordering and review of laboratory studies, ordering and review of radiographic studies, pulse oximetry and re-evaluation of patient's condition.  Tessie Fass MSN, AGACNP-BC [facility] Pulmonary/Critical Care Medicine Amion for pager 06/20/2021, 6:38 PM

## 2021-06-20 NOTE — Progress Notes (Signed)
2D echo attempted, but consult in room. Will try later

## 2021-06-20 NOTE — Consult Note (Addendum)
Cardiology Consultation:   Patient ID: Karen Perry MRN: [id number]; DOB: [birth year]  Admit date: 06/20/2021 Date of Consult: 06/20/2021  PCP:  Adin Hector, MD   [place] Providers Cardiologist:  New   Patient Profile:   Karen Perry is a 56 y.o. female with a hx of recently diagnosed stage III/IV COPD, asthma, hypertension, anxiety and bipolar disorder who is being seen 06/20/2021 for the evaluation of CHF and elevated troponin at the request of Dr. Tamala Julian.  History of Present Illness:   Karen Perry is a prior smoker.  Quit in 2017.  She has chronic history of intermittent shortness of breath thought due to allergies and asthma.  Seen by pulmonologist 06/03/2021.  She was diagnosed with COPD.  Patient reported worsening shortness of breath since Monday evening.  Intermittent chest tightness across her chest.  Also nausea and vomiting intermittently.  Usually nebulizers helps with her breathing but this time it did not.  Occasional radiation of chest tightness to her back.  She sleeps on reclining position for years but recently worsened.  Also has intermittent lower extremity edema, noted especially after eating high salt diet.  Due to worsening symptoms EMS was called they found hypoxic at 85% on room air.  Improved to 100% on 10 L nonrebreather.  Briefly placed on BiPAP in emergency room.  BNP 2324 High-sensitivity troponin 760 Leukocytosis Hemoglobin 16.2 Chest x-ray suggestive of pulmonary edema +/- multifocal pneumonia.  She was given nebulizer, IV steroid and lasix 40 mg x 1. Started on azithromycin and ceftriaxone. Started on IV heparin for anticoagulation.  Past Medical History:  Diagnosis Date   Asthma    Bipolar 1 disorder ([place])    Diabetes mellitus without complication ([place])    Hypertension    Seizures ([place])     Past Surgical History:  Procedure Laterality Date   BACK SURGERY      Inpatient Medications: Scheduled Meds:   albuterol  2.5 mg Nebulization QID   atorvastatin  10 mg Oral Daily   busPIRone  10 mg Oral BID   fluticasone furoate-vilanterol  1 puff Inhalation Daily   And   umeclidinium bromide  1 puff Inhalation Daily   furosemide  40 mg Intravenous BID   sodium chloride flush  3 mL Intravenous Q12H   Continuous Infusions:  azithromycin 500 mg (06/20/21 1401)   cefTRIAXone (ROCEPHIN)  IV Stopped (06/20/21 1405)   heparin 1,100 Units/hr (06/20/21 1117)   PRN Meds: acetaminophen **OR** acetaminophen, albuterol, LORazepam  Allergies:    Allergies  Allergen Reactions   Septra [Sulfamethoxazole-Trimethoprim] Hives    Social History:   Social History   Socioeconomic History   Marital status: Married    Spouse name: Not on file   Number of children: Not on file   Years of education: Not on file   Highest education level: Not on file  Occupational History   Not on file  Tobacco Use   Smoking status: Every Day    Packs/day: 0.50    Types: Cigarettes   Smokeless tobacco: Not on file  Substance and Sexual Activity   Alcohol use: No   Drug use: Not on file   Sexual activity: Not on file  Other Topics Concern   Not on file  Social History Narrative   Not on file   Social Determinants of Health   Financial Resource Strain: Not on file  Food Insecurity: Not on file  Transportation Needs: Not on file  Physical Activity: Not on  file  Stress: Not on file  Social Connections: Not on file  Intimate Partner Violence: Not on file    Family History:   Does not know family history. Family History  Adopted: Yes     ROS:  Please see the history of present illness.  All other ROS reviewed and negative.     Physical Exam/Data:   Vitals:   06/20/21 1045 06/20/21 1100 06/20/21 1300 06/20/21 1415  BP: 111/85 104/83  105/86  Pulse: (!) 121 (!) 121 (!) 123 (!) 120  Resp: (!) 28 (!) 30 16 (!) 28  Temp:      TempSrc:      SpO2: 98% 99% 97% 96%  Weight:      Height:         Intake/Output Summary (Last 24 hours) at 06/20/2021 1449 Last data filed at 06/20/2021 1117 Gross per 24 hour  Intake 38.86 ml  Output --  Net 38.86 ml      06/20/2021    9:55 AM 12/13/2014    4:09 PM  Last 3 Weights  Weight (lbs) 207 lb 0.2 oz 207 lb  Weight (kg) 93.9 kg 93.895 kg     Body mass index is 30.57 kg/m.  General: Ill-appearing, tachypneic HEENT: normal Neck: no JVD Cardiac:  normal S1, S2; Regular tachycardia; no murmur  Lungs:  Diminished breath sound  Abd: soft, nontender, no hepatomegaly  [id number] edema Musculoskeletal:  No deformities Skin: Cold Neuro:  no focal abnormalities noted Psych:  Normal affect   EKG:  The EKG was personally reviewed and demonstrates: Sinus tachycardia, anterior Q waves, left axis deviation Telemetry:  Telemetry was personally reviewed and demonstrates:  Sinus tachycardia, rates 120s to 130s  Relevant CV Studies:  Pending echo   Laboratory Data:  High Sensitivity Troponin:   Recent Labs  Lab 06/20/21 0920  TROPONINIHS 760*     Chemistry Recent Labs  Lab 06/20/21 0920 06/20/21 0928  NA 142 141  K 4.2 4.3  CL 106  --   CO2 27  --   GLUCOSE 156*  --   BUN 26*  --   CREATININE 0.89  --   CALCIUM 10.4*  --   GFRNONAA >60  --   ANIONGAP 9  --     Hematology Recent Labs  Lab 06/20/21 0920 06/20/21 0928  WBC 24.4*  --   RBC 5.33*  --   HGB 16.2* 17.0*  HCT 52.1* 50.0*  MCV 97.7  --   MCH 30.4  --   MCHC 31.1  --   RDW 13.6  --   PLT 247  --    BNP Recent Labs  Lab 06/20/21 0920  BNP 2,324.8*    Radiology/Studies:  DG Chest Port 1 View  Result Date: 06/20/2021 CLINICAL DATA:  sob EXAM: PORTABLE CHEST 1 VIEW COMPARISON:  Chest x-ray December 15, 2015. More recent priors are not available. FINDINGS: Diffuse interstitial and patchy bibasilar airspace opacities. No definite pleural effusions. No visible pneumothorax. Cardiomediastinal silhouette is within normal limits. No acute fracture. IMPRESSION:  Diffuse interstitial and patchy bibasilar airspace opacities, concerning for edema and/or multifocal pneumonia. Electronically Signed   By: Margaretha Sheffield M.D.   On: 06/20/2021 09:38     Assessment and Plan:   Acute combined systolic and diastolic heart failure: Echo with EF 20 to 25%, possible Takotsubo wall motion abnormality pattern but will need to rule out obstructive CAD.  She appears borderline cardiogenic shock, pressures are soft and she is in  sinus tachycardia 120s to 130s and is cold on exam.  Discussed with Dr. Haroldine Laws in Advanced Heart Failure and will take to Cath Lab for RHC/LHC  Risks and benefits of cardiac catheterization have been discussed with the patient and her mother.  These include bleeding, infection, kidney damage, stroke, heart attack, death.  The patient understands these risks and is willing to proceed.   Acute hypoxic respiratory failure secondary to heart failure exacerbation as above, started on diuresis  Elevated troponin: Suspect due to decompensated heart failure as above but will plan for LHC to rule out obstructive CAD   Risk Assessment/Risk Scores:  [id number]}   TIMI Risk Score for Unstable Angina or Non-ST Elevation MI:   The patient's TIMI risk score is 2, which indicates a 8% risk of all cause mortality, new or recurrent myocardial infarction or need for urgent revascularization in the next 14 days.  New York Heart Association (NYHA) Functional Class NYHA Class III  For questions or updates, please contact [facility] Please consult [email or web address] for contact info under    Jarrett Soho, PA  06/20/2021 2:49 PM   Donato Heinz, MD

## 2021-06-20 NOTE — H&P (Signed)
History and Physical    Patient: Karen Perry [id number] DOB: 1965/07/22 DOA: 06/20/2021 DOS: the patient was seen and examined on 06/20/2021 PCP: Lynnea Ferrier, MD  Patient coming from: Home  Chief Complaint:  Chief Complaint  Patient presents with   Shortness of Breath   HPI: Karen Perry is a 56 y.o. female with medical history significant of hypertension, moderate persistent asthma, bipolar 1 disorder, and anxiety who presents with complaints of shortness of breath.  History is obtained from the patient and her husband who is present at bedside.  It was reported that patient has a pretty significant history of asthma with chronic bronchitis and seasonal allergies.  Her and her husband had gone to the [employer] 3 days ago and later that night she had started to feel like she was having some difficulty breathing along with chills.  The following day she developed nausea and vomiting and breathing seem to worsen.  She reported having several episodes of nonbloody emesis.  Her husband initially thought symptoms were possibly related to food poisoning, but he ate similar foods and had no symptoms.  Patient reports that she has had lower extremity swelling that has been acutely worse for which her close have been fitting tighter around her ankles.  The patient had also been noted to complain of intermittent pains across her chest for which she was scheduled for echocardiogram by her primary care provider.  She had tried using her home inhalers and nebulizer several times without any improvement in symptoms.  The patient husband states that she has not eaten since Monday and vomiting symptoms have persisted.  Additional symptoms include generalized weakness for which her husband was needing to help her get up.  when admission to the emergency department patient was noted to be afebrile with pulse 121-127, respiration 28-30, and O2 saturations as low as 85% on room air.   She was transitioned to BiPAP in the ED with O2 saturations currently maintained due to work of breathing.  Labs significant for WBC 24.4 hemoglobin 16.2, BUN 26, creatinine 0.89, glucose 156, BNP 2324.8, high-sensitivity troponin initially 760.  Venous blood gas noted pH of 7.383 with PCO2 54.5.  Chest x-ray revealed diffuse interstitial and patchy bibasilar airspace opacities concerning for edema and/or multifocal pneumonia.  EKG did not note any significant ischemic changes.  Cardiology have been formally consulted.  He patient had been given Solu-Medrol 125 mg IV, 2 g of magnesium sulfate, DuoNeb breathing treatment, continuous albuterol neb, furosemide 20 mg IV, and started on heparin drip.  Review of Systems: unable to review all systems due to the inability of the patient to answer questions due to the severity of her symptoms and her being on BiPAP. Past Medical History:  Diagnosis Date   Asthma    Bipolar 1 disorder ([facility])    Diabetes mellitus without complication ([facility])    Hypertension    Seizures ([facility])    Past Surgical History:  Procedure Laterality Date   BACK SURGERY     Social History:  reports that she has been smoking cigarettes. She has been smoking an average of .5 packs per day. She does not have any smokeless tobacco history on file. She reports that she does not drink alcohol. No history on file for drug use.  Allergies  Allergen Reactions   Septra [Sulfamethoxazole-Trimethoprim] Hives    Family History  Adopted: Yes    Prior to Admission medications   Medication Sig Start Date End Date  Taking? Authorizing Provider  albuterol (PROVENTIL) (2.5 MG/3ML) 0.083% nebulizer solution USE 1 VIAL IN NEBULIZER EVERY 6 HOURS AS NEEDED FOR WHEEZING 06/03/21  Yes [provider]  atorvastatin (LIPITOR) 10 MG tablet Take 10 mg by mouth daily. 05/23/21  Yes [provider]  busPIRone (BUSPAR) 10 MG tablet Take 10 mg by mouth 2 (two) times daily.   Yes [provider]  lamoTRIgine (LAMICTAL) 100 MG tablet Take 200 mg by mouth daily.   Yes [provider]  losartan (COZAAR) 25 MG tablet Take 25 mg by mouth daily. 05/23/21  Yes [provider]  metoprolol tartrate (LOPRESSOR) 25 MG tablet Take 25 mg by mouth daily. 05/23/21  Yes [provider]  montelukast (SINGULAIR) 10 MG tablet Take 10 mg by mouth at bedtime. 05/20/21  Yes [provider]  tiZANidine (ZANAFLEX) 2 MG tablet Take 2 mg by mouth every 8 (eight) hours as needed. 06/06/21  Yes [provider]  TRELEGY ELLIPTA 100-62.5-25 MCG/ACT AEPB Take 1 puff by mouth daily. 06/03/21  Yes [provider]    Physical Exam: Vitals:   06/20/21 1022 06/20/21 1026 06/20/21 1045 06/20/21 1100  BP:   111/85 104/83  Pulse:  (!) 123 (!) 121 (!) 121  Resp:  (!) 30 (!) 28 (!) 30  Temp:      TempSrc:      SpO2: 100% 100% 98% 99%  Weight:      Height:       Exam  Constitutional: Middle-aged female who appears to be in some respiratory distress Eyes: PERRL, lids and conjunctivae normal ENMT: Mucous membranes are moist. Posterior pharynx clear of any exudate or lesions.  Neck: normal, supple, JVD present. Respiratory: Tachypneic with decreased aeration without significant wheezes appreciated at this time.  Patient currently on BiPAP able to talk and shortened sentences. Cardiovascular: Tachycardic.  At least +1 pitting lower extremity edema. 2+ pedal pulses.    Abdomen: no tenderness, no masses palpated.  Bowel sounds positive.  Musculoskeletal: no clubbing / cyanosis. No joint deformity upper and lower extremities. Good ROM, no contractures. Normal muscle tone.  Skin: no rashes, lesions, ulcers. No induration Neurologic: CN 2-12 grossly intact. Strength 5/5 in all 4.  Psychiatric: Normal judgment and insight. Alert and oriented x 3.  Anxious mood.   Data Reviewed:  EKG reveals sinus tachycardia at 128 bpm with a left anterior fascicular block    Assessment and Plan: Acute respiratory failure with hypoxia though secondary to congestive heart failure exacerbation Patient presented with acute shortness of breath and was found to be hypoxic down into the 80s on room air with improvement nonrebreather, but switch to BiPAP due to work of breathing.  Venous blood gas did not note any significant signs of hypercapnia.  Physical exam revealed JVD and pitting edema. Labs significant for BNP of 2324.8 she has been given Lasix 20 mg IV in the ED.  Suspect secondary to congestive heart failure exacerbation plus minus possibility of pneumonia. -Admit to a progressive bed -Continuous pulse oximetry with oxygen maintain O2 saturation greater than 92% -Continue BiPAP  -Strict I&O's and daily weights -Give additional 20 mg of Lasix IV x1 dose and then give furosemide 40 mg twice daily if blood pressures will allow -Appreciate cardiology consultative services we will follow-up for any further recommendations  Elevated troponin Acute.  Patient has been complaining of intermittent chest pains which are primary had scheduled her to have an echocardiogram.  Initial elevated troponin of 760.  EKG did not note signs of a STEMI. -Continue heparin drip per pharmacy -Continue to trend cardiac troponin -Follow-up for any further recommendations by cardiology  SIRS Patient was noted to be tachycardic and tachypneic with initial white blood cell count elevated at 24.4.  Chest x-ray noted concern for possible edema and/or superimposed pneumonia.  She had been given several breathing treatments for which suspect lactic acid to be elevated. -Check blood and sputum culture if able -Check procalcitonin -Start empiric antibiotics of Rocephin and azithromycin  Possible aspiration pneumonia Acute.  Chest x-ray as noted above.  She had reported complaints of nausea, and chills prior to worsening of her breathing.  Question possibility of aspiration although patient  denies any reports of coughing. -Aspiration precautions with elevation head of the bed -Antibiotics as noted above  Moderate persistent asthma At home patient had reported wheezing.  She had been given Solu-Medrol 125 mg IV, magnesium sulfate 2 g IV, and several breathing treatments in the ED.  On physical exam no significant wheezing appreciated at this time. -Continue Singulair and pharmacy substitution for Trelegy Ellipta -Albuterol nebs 4 times daily and as needed -Reassess in a.m. and determine if steroids need to be continued  Essential hypertension Home medication regimen includes metoprolol 25 mg nightly and losartan 25 mg daily. -Continue home regimen as tolerated  Anxiety Home medication regimen includes BuSpar 10 mg twice daily. -Ativan IV as needed in the acute setting while on BiPAP -Resume BuSpar once able  Hyperlipidemia Home medication regimen includes atorvastatin 10 mg daily. -Check lipid panel -Continue statin  Obesity BMI 30.57 kg/m   Advance Care Planning:   Code Status: Full Code    Consults: Cardiology  Family Communication: Patient's family updated at bedside  Severity of Illness: The appropriate patient status for this patient is INPATIENT. Inpatient status is judged to be reasonable and necessary in order to provide the required intensity of service to ensure the patient's safety. The patient's presenting symptoms, physical exam findings, and initial radiographic and laboratory data in the context of their chronic comorbidities is felt to place them at high risk for further clinical deterioration. Furthermore, it is not anticipated that the patient will be medically stable for discharge from the hospital within 2 midnights of admission.   * I certify that at the point of admission it is my clinical judgment that the patient will require inpatient hospital care spanning beyond 2 midnights from the point of admission due to high intensity of service, high  risk for further deterioration and high frequency of surveillance required.*  Author: Clydie Braun, MD 06/20/2021 11:35 AM  For on call review [email or web address].

## 2021-06-20 NOTE — Progress Notes (Signed)
PCCM interval progress note:  Pt seen on venti mask, awake and alert and respiratory status stable.  Bipap being set up and pt encouraged to wear as much as possible tonight.    Darcella Gasman Angely Dietz, PA-C

## 2021-06-20 NOTE — Progress Notes (Signed)
RT removed pt from BiPAP and placed on 3L [place]. Pt tolerating well at this time with SVS. RT will continue to monitor pt. RN of pt notified.

## 2021-06-20 NOTE — Progress Notes (Signed)
ANTICOAGULATION CONSULT NOTE - Follow Up Consult  Pharmacy Consult for Heparin infusion Indication: chest pain/ACS  Allergies  Allergen Reactions   Septra [Sulfamethoxazole-Trimethoprim] Hives    Patient Measurements: Height: 5\' 9"  (175.3 cm) Weight: 93.9 kg (207 lb 0.2 oz) IBW/kg (Calculated) : 66.2 Heparin Dosing Weight: 86.1 kg  Vital Signs: Temp: 97.6 F (36.4 C) (05/18 0934) Temp Source: Oral (05/18 0934) BP: 130/115 (05/18 1750) Pulse Rate: 134 (05/18 1750)  Labs: Recent Labs    06/20/21 0920 06/20/21 0928  HGB 16.2* 17.0*  HCT 52.1* 50.0*  PLT 247  --   CREATININE 0.89  --   TROPONINIHS 760*  --      Estimated Creatinine Clearance: 87.2 mL/min (by C-G formula based on SCr of 0.89 mg/dL).   Medical History: Past Medical History:  Diagnosis Date   Asthma    Bipolar 1 disorder ([facility])    Diabetes mellitus without complication ([facility])    Hypertension    Seizures ([facility])     Medications:  Medications Prior to Admission  Medication Sig Dispense Refill Last Dose   albuterol (PROVENTIL) (2.5 MG/3ML) 0.083% nebulizer solution USE 1 VIAL IN NEBULIZER EVERY 6 HOURS AS NEEDED FOR WHEEZING   06/19/2021   atorvastatin (LIPITOR) 10 MG tablet Take 10 mg by mouth daily.   06/19/2021   busPIRone (BUSPAR) 10 MG tablet Take 10 mg by mouth 2 (two) times daily.   06/19/2021   lamoTRIgine (LAMICTAL) 100 MG tablet Take 200 mg by mouth daily.   06/19/2021   losartan (COZAAR) 25 MG tablet Take 25 mg by mouth daily.   06/19/2021   metoprolol tartrate (LOPRESSOR) 25 MG tablet Take 25 mg by mouth daily.   06/19/2021 at 2000   montelukast (SINGULAIR) 10 MG tablet Take 10 mg by mouth at bedtime.   06/19/2021   tiZANidine (ZANAFLEX) 2 MG tablet Take 2 mg by mouth every 8 (eight) hours as needed.   06/19/2021   TRELEGY ELLIPTA 100-62.5-25 MCG/ACT AEPB Take 1 puff by mouth daily.   06/19/2021    Assessment: 56 yo F presented to the ED with symptoms of SOB and found to have O2 Sat 85% on RA  improved to 100% on 10L [facility]. Pt placed on BiPAP. BNP 2324.8 and troponin 760. Pt was not taking any anticoagulation prior to admission. Pharmacy consulted to dose heparin as per ACS protocol.   Patient ECHO with EF 20%, potential Tako-tsubo, and LV apical thrombus. S/p cath started on milrinone and norepinephrine. TR band in place. Pharmacy consulted to resume heparin 2 hrs after TR band removal. TR banded removed at 2100 per RN.   Goal of Therapy:  Heparin level 0.3-0.7 units/ml Monitor platelets by anticoagulation protocol: Yes   Plan:  Start heparin 1300 units/hr 2 hrs after TR band removal at 2300 Check 6 hr heparin level Monitor heparin level, CBC and s/s of bleeding   53, PharmD, BCPS Clinical Pharmacist 06/20/2021 6:35 PM

## 2021-06-20 NOTE — ED Provider Notes (Signed)
[facility] EMERGENCY DEPARTMENT Provider Note   CSN: [id number] Arrival date & time: 06/20/21  2751     History  Chief Complaint  Patient presents with   Shortness of Breath    Karen Perry is a 56 y.o. female.  64 y oF with a chief complaint of difficulty breathing.  She tells me this has been going on for about a month now.  Progressively worsening.  Called out this morning for worsening difficulty and found to be in the 80s on room air and was placed on nonrebreather and brought to the emergency department for evaluation.  She has been coughing a bit but denies any fevers.  Denies any chest pain or pressure.   Shortness of Breath     Home Medications Prior to Admission medications   Medication Sig Start Date End Date Taking? Authorizing Provider  albuterol (PROVENTIL) (2.5 MG/3ML) 0.083% nebulizer solution USE 1 VIAL IN NEBULIZER EVERY 6 HOURS AS NEEDED FOR WHEEZING 06/03/21  Yes [provider]  atorvastatin (LIPITOR) 10 MG tablet Take 10 mg by mouth daily. 05/23/21  Yes [provider]  busPIRone (BUSPAR) 10 MG tablet Take 10 mg by mouth 2 (two) times daily.   Yes [provider]  lamoTRIgine (LAMICTAL) 100 MG tablet Take 200 mg by mouth daily.   Yes [provider]  losartan (COZAAR) 25 MG tablet Take 25 mg by mouth daily. 05/23/21  Yes [provider]  metoprolol tartrate (LOPRESSOR) 25 MG tablet Take 25 mg by mouth daily. 05/23/21  Yes [provider]  montelukast (SINGULAIR) 10 MG tablet Take 10 mg by mouth at bedtime. 05/20/21  Yes [provider]  tiZANidine (ZANAFLEX) 2 MG tablet Take 2 mg by mouth every 8 (eight) hours as needed. 06/06/21  Yes [provider]  TRELEGY ELLIPTA 100-62.5-25 MCG/ACT AEPB Take 1 puff by mouth daily. 06/03/21  Yes [provider]      Allergies    Septra [sulfamethoxazole-trimethoprim]    Review of Systems   Review of Systems   Respiratory:  Positive for shortness of breath.    Physical Exam Updated Vital Signs BP 104/83   Pulse (!) 121   Temp 97.6 F (36.4 C) (Oral)   Resp (!) 30   Ht 5\' 9"  (1.753 m)   Wt 93.9 kg   LMP 10/16/2014   SpO2 99%   BMI 30.57 kg/m  Physical Exam Vitals and nursing note reviewed.  Constitutional:      General: She is not in acute distress.    Appearance: She is well-developed. She is not diaphoretic.  HENT:     Head: Normocephalic and atraumatic.  Eyes:     Pupils: Pupils are equal, round, and reactive to light.  Cardiovascular:     Rate and Rhythm: Normal rate and regular rhythm.     Heart sounds: No murmur heard.   No friction rub. No gallop.  Pulmonary:     Effort: Pulmonary effort is normal.     Breath sounds: No wheezing or rales.     Comments: Tachypnea, diminished breath sounds in all fields with diffuse wheezes. Abdominal:     General: There is no distension.     Palpations: Abdomen is soft.     Tenderness: There is no abdominal tenderness.  Musculoskeletal:        General: No tenderness.     Cervical back: Normal range of motion and neck supple.  Skin:    General: Skin is warm  and dry.  Neurological:     Mental Status: She is alert and oriented to person, place, and time.  Psychiatric:        Behavior: Behavior normal.    ED Results / Procedures / Treatments   Labs (all labs ordered are listed, but only abnormal results are displayed) Labs Reviewed  CBC WITH DIFFERENTIAL/PLATELET - Abnormal; Notable for the following components:      Result Value   WBC 24.4 (*)    RBC 5.33 (*)    Hemoglobin 16.2 (*)    HCT 52.1 (*)    Neutro Abs 20.4 (*)    Monocytes Absolute 2.3 (*)    Abs Immature Granulocytes 0.24 (*)    All other components within normal limits  BASIC METABOLIC PANEL - Abnormal; Notable for the following components:   Glucose, Bld 156 (*)    BUN 26 (*)    Calcium 10.4 (*)    All other components within normal limits  BRAIN  NATRIURETIC PEPTIDE - Abnormal; Notable for the following components:   B Natriuretic Peptide 2,324.8 (*)    All other components within normal limits  I-STAT VENOUS BLOOD GAS, ED - Abnormal; Notable for the following components:   Bicarbonate 32.4 (*)    TCO2 34 (*)    Acid-Base Excess 5.0 (*)    HCT 50.0 (*)    Hemoglobin 17.0 (*)    All other components within normal limits  TROPONIN I (HIGH SENSITIVITY) - Abnormal; Notable for the following components:   Troponin I (High Sensitivity) 760 (*)    All other components within normal limits    EKG EKG Interpretation  Date/Time:  Thursday Jun 20 2021 09:16:14 EDT Ventricular Rate:  128 PR Interval:  181 QRS Duration: 86 QT Interval:  305 QTC Calculation: 445 R Axis:   -50 Text Interpretation: Sinus tachycardia Left anterior fascicular block Probable anteroseptal infarct, recent Lateral leads are also involved No significant change since last tracing Confirmed by Melene Plan [phone]) on 06/20/2021 9:50:10 AM  Radiology DG Chest Port 1 View  Result Date: 06/20/2021 CLINICAL DATA:  sob EXAM: PORTABLE CHEST 1 VIEW COMPARISON:  Chest x-ray December 15, 2015. More recent priors are not available. FINDINGS: Diffuse interstitial and patchy bibasilar airspace opacities. No definite pleural effusions. No visible pneumothorax. Cardiomediastinal silhouette is within normal limits. No acute fracture. IMPRESSION: Diffuse interstitial and patchy bibasilar airspace opacities, concerning for edema and/or multifocal pneumonia. Electronically Signed   By: Feliberto Harts M.D.   On: 06/20/2021 09:38    Procedures Procedures    Medications Ordered in ED Medications  albuterol (PROVENTIL) (2.5 MG/3ML) 0.083% nebulizer solution (5 mg/hr Nebulization Not Given 06/20/21 1025)  heparin ADULT infusion 100 units/mL (25000 units/290mL) (1,100 Units/hr Intravenous New Bag/Given 06/20/21 1117)  ipratropium-albuterol (DUONEB) 0.5-2.5 (3) MG/3ML nebulizer solution  3 mL (3 mLs Nebulization Given 06/20/21 0940)  methylPREDNISolone sodium succinate (SOLU-MEDROL) 125 mg/2 mL injection 125 mg (125 mg Intravenous Given 06/20/21 1017)  magnesium sulfate IVPB 2 g 50 mL (0 g Intravenous Stopped 06/20/21 1117)  furosemide (LASIX) injection 20 mg (20 mg Intravenous Given 06/20/21 1017)  albuterol (PROVENTIL) (2.5 MG/3ML) 0.083% nebulizer solution (  Given 06/20/21 1025)  heparin bolus via infusion 4,000 Units (4,000 Units Intravenous Bolus from Bag 06/20/21 1117)    ED Course/ Medical Decision Making/ A&P                           Medical Decision Making  Amount and/or Complexity of Data Reviewed Labs: ordered. Radiology: ordered.  Risk Prescription drug management. Decision regarding hospitalization.   56 yo F with a chief complaint of difficulty breathing.  This been going on for at least a month she said.  She is recently seen a pulmonologist and was diagnosed with severe COPD.  She felt a bit worse this morning when she woke up and ended up calling EMS.  Was found to be hypoxic on room air.  I took her off of oxygen and she quickly desaturated into the 70s.  Will place on CPAP.  3 DuoNebs back-to-back steroids chest x-ray reassess.   The patient is feeling mildly better on reassessment.  Still with diffuse wheezes and diminished breath sounds.  Will start on BiPAP.  Started on continuous neb.  Chest x-ray independently interpreted by me with likely fluid overload.  We will give a dose of Lasix.  Patient's BNP is resulted and is elevated.  Troponin is also greater than 700.  I discussed this with Trish, cardmaster recommended medical admission and cardiology would see in consult.  CRITICAL CARE Performed by: Rae Roam   Total critical care time: 35 minutes  Critical care time was exclusive of separately billable procedures and treating other patients.  Critical care was necessary to treat or prevent imminent or life-threatening  deterioration.  Critical care was time spent personally by me on the following activities: development of treatment plan with patient and/or surrogate as well as nursing, discussions with consultants, evaluation of patient's response to treatment, examination of patient, obtaining history from patient or surrogate, ordering and performing treatments and interventions, ordering and review of laboratory studies, ordering and review of radiographic studies, pulse oximetry and re-evaluation of patient's condition.  The patients results and plan were reviewed and discussed.   Any x-rays performed were independently reviewed by myself.   Differential diagnosis were considered with the presenting HPI.  Medications  albuterol (PROVENTIL) (2.5 MG/3ML) 0.083% nebulizer solution (5 mg/hr Nebulization Not Given 06/20/21 1025)  heparin ADULT infusion 100 units/mL (25000 units/2102mL) (1,100 Units/hr Intravenous New Bag/Given 06/20/21 1117)  ipratropium-albuterol (DUONEB) 0.5-2.5 (3) MG/3ML nebulizer solution 3 mL (3 mLs Nebulization Given 06/20/21 0940)  methylPREDNISolone sodium succinate (SOLU-MEDROL) 125 mg/2 mL injection 125 mg (125 mg Intravenous Given 06/20/21 1017)  magnesium sulfate IVPB 2 g 50 mL (0 g Intravenous Stopped 06/20/21 1117)  furosemide (LASIX) injection 20 mg (20 mg Intravenous Given 06/20/21 1017)  albuterol (PROVENTIL) (2.5 MG/3ML) 0.083% nebulizer solution (  Given 06/20/21 1025)  heparin bolus via infusion 4,000 Units (4,000 Units Intravenous Bolus from Bag 06/20/21 1117)    Vitals:   06/20/21 1022 06/20/21 1026 06/20/21 1045 06/20/21 1100  BP:   111/85 104/83  Pulse:  (!) 123 (!) 121 (!) 121  Resp:  (!) 30 (!) 28 (!) 30  Temp:      TempSrc:      SpO2: 100% 100% 98% 99%  Weight:      Height:        Final diagnoses:  Acute on chronic respiratory failure with hypoxia (HCC)  COPD exacerbation (HCC)  Acute congestive heart failure, unspecified heart failure type (HCC)     Admission/ observation were discussed with the admitting physician, patient and/or family and they are comfortable with the plan.          Final Clinical Impression(s) / ED Diagnoses Final diagnoses:  Acute on chronic respiratory failure with hypoxia (HCC)  COPD exacerbation (HCC)  Acute congestive heart  failure, unspecified heart failure type [facility])    Rx / DC Orders ED Discharge Orders     None         Melene Plan, DO 06/20/21 1144

## 2021-06-20 NOTE — ED Triage Notes (Signed)
Pt BIB EMS from home for [facility], states that pt saw her PCP for "not feeling well" and was referred to pulmonologist on Thursday. Pt found to be 85% RA on EMS arrival improved to 100% on 10L NRB. Increased ShOB since Thursday took rescue albuterol inhaler with no improvement. Hx COPD and asthma

## 2021-06-20 NOTE — ED Notes (Signed)
MD Adela Lank made aware of critical troponin

## 2021-06-20 NOTE — Progress Notes (Signed)
ANTICOAGULATION CONSULT NOTE - Initial Consult  Pharmacy Consult for Heparin infusion Indication: chest pain/ACS  Allergies  Allergen Reactions   Septra [Sulfamethoxazole-Trimethoprim] Hives    Patient Measurements: Height: 5\' 9"  (175.3 cm) Weight: 93.9 kg (207 lb 0.2 oz) IBW/kg (Calculated) : 66.2 Heparin Dosing Weight: 86.1 kg  Vital Signs: Temp: 97.6 F (36.4 C) (05/18 0934) Temp Source: Oral (05/18 0934) BP: 111/85 (05/18 1045) Pulse Rate: 121 (05/18 1045)  Labs: Recent Labs    06/20/21 0920 06/20/21 0928  HGB 16.2* 17.0*  HCT 52.1* 50.0*  PLT 247  --   CREATININE 0.89  --   TROPONINIHS 760*  --     Estimated Creatinine Clearance: 87.2 mL/min (by C-G formula based on SCr of 0.89 mg/dL).   Medical History: Past Medical History:  Diagnosis Date   Asthma    Bipolar 1 disorder ([place])    Diabetes mellitus without complication ([place])    Hypertension    Seizures ([place])     Medications:  (Not in a hospital admission)   Assessment: 56 yo F presented to the ED with symptoms of SOB and found to have O2 Sat 85% on RA improved to 100% on 10L NRB. Pt placed on BiPAP. BNP 2324.8 and troponin 760. Pt was not taking any anticoagulation prior to admission. Pharmacy consulted to dose heparin as per ACS protocol.   Hgb 17, Plt 247  Goal of Therapy:  Heparin level 0.3-0.7 units/ml Monitor platelets by anticoagulation protocol: Yes   Plan:  Give 4000 units bolus x 1 Start heparin infusion at 1100 units/hr Heparin level in 6 hours and repeat every 6 hours until in therapeutic range x2 Daily CBC, heparin level, and monitor for s/sx of bleeding  Kaleen Mask 06/20/2021,10:47 AM

## 2021-06-20 NOTE — Consult Note (Addendum)
Advanced Heart Failure Team Consult Note   Primary Physician: Lynnea Ferrier, MD PCP-Cardiologist:  None  Reason for Consultation: Acute Heart Failure   HPI:    Karen Perry is seen today for evaluation of Acute Heart Failure  at the request of Dr Jerene Pitch.   Karen Perry is a 56 year old with COPD, asthma, htn, anxiety, bipolar disorder, and seizures.  Presented to ED with increased shortness of breath and chest tightness. Initially on NRB -->Bipap. CXR with pulmonary edema BNP > 2300 HS Trop 760 Hgb 16.2, creatinine 0.89. Pro calcitonin pending.  ED course: albuterol, given steroids, ceftriaxone & azithromycin, heparin drip, and  40 mg IV lasix.   Echo  EF 20-25% RV normal, Possible apical thrombus, possible Taktsubo.   Moved urgently to the cath lab. Given 80 mg IV lasix in the cath lab.   [place] #s  RA 10  PA 47/28 (37)  RV 41/13  PCWP 21 SVR 1686  CO 4.64  CI 2.21  PA Sat   Review of Systems: [y] = yes, [ ]  = no   General: Weight gain [ ] ; Weight loss [ ] ; Anorexia [ ] ; Fatigue [ Y]; Fever [ ] ; Chills [ ] ; Weakness [ ]   Cardiac: Chest pain/pressure [ ] ; Resting SOB [ Y]; Exertional SOB [ Y]; Orthopnea [ Y]; Pedal Edema [ ] ; Palpitations [ ] ; Syncope [ ] ; Presyncope [ ] ; Paroxysmal nocturnal dyspnea[ ]   Pulmonary: Cough [ ] ; Wheezing[ ] ; Hemoptysis[ ] ; Sputum [ ] ; Snoring [ ]   GI: Vomiting[ ] ; Dysphagia[ ] ; Melena[ ] ; Hematochezia [ ] ; Heartburn[ ] ; Abdominal pain [ ] ; Constipation [ ] ; Diarrhea [ ] ; BRBPR [ ]   GU: Hematuria[ ] ; Dysuria [ ] ; Nocturia[ ]   Vascular: Pain in legs with walking [ ] ; Pain in feet with lying flat [ ] ; Non-healing sores [ ] ; Stroke [ ] ; TIA [ ] ; Slurred speech [ ] ;  Neuro: Headaches[ ] ; Vertigo[ ] ; Seizures[ Y]; Paresthesias[ ] ;Blurred vision [ ] ; Diplopia [ ] ; Vision changes [ ]   Ortho/Skin: Arthritis [ ] ; Joint pain [Y ]; Muscle pain [ ] ; Joint swelling [ ] ; Back Pain [ Y]; Rash [ ]   Psych: Depression[Y ]; Anxiety[ Y]  Heme:  Bleeding problems [ ] ; Clotting disorders [ ] ; Anemia [ ]   Endocrine: Diabetes [ Y]; Thyroid dysfunction[ ]   Home Medications Prior to Admission medications   Medication Sig Start Date End Date Taking? Authorizing Provider  albuterol (PROVENTIL) (2.5 MG/3ML) 0.083% nebulizer solution USE 1 VIAL IN NEBULIZER EVERY 6 HOURS AS NEEDED FOR WHEEZING 06/03/21  Yes [provider]  atorvastatin (LIPITOR) 10 MG tablet Take 10 mg by mouth daily. 05/23/21  Yes [provider]  busPIRone (BUSPAR) 10 MG tablet Take 10 mg by mouth 2 (two) times daily.   Yes [provider]  lamoTRIgine (LAMICTAL) 100 MG tablet Take 200 mg by mouth daily.   Yes [provider]  losartan (COZAAR) 25 MG tablet Take 25 mg by mouth daily. 05/23/21  Yes [provider]  metoprolol tartrate (LOPRESSOR) 25 MG tablet Take 25 mg by mouth daily. 05/23/21  Yes [provider]  montelukast (SINGULAIR) 10 MG tablet Take 10 mg by mouth at bedtime. 05/20/21  Yes [provider]  tiZANidine (ZANAFLEX) 2 MG tablet Take 2 mg by mouth every 8 (eight) hours as needed. 06/06/21  Yes [provider]  TRELEGY ELLIPTA 100-62.5-25 MCG/ACT AEPB Take 1 puff by mouth daily. 06/03/21  Yes [provider]    Past Medical History: Past Medical History:  Diagnosis Date   Asthma    Bipolar 1 disorder (HCC)    Diabetes mellitus without complication (HCC)    Hypertension    Seizures (HCC)     Past Surgical History: Past Surgical History:  Procedure Laterality Date   BACK SURGERY      Family History: Family History  Adopted: Yes    Social History: Social History   Socioeconomic History   Marital status: Married    Spouse name: Not on file   Number of children: Not on file   Years of education: Not on file   Highest education level: Not on file  Occupational History   Not on file  Tobacco Use   Smoking status: Every Day    Packs/day: 0.50    Types: Cigarettes    Smokeless tobacco: Not on file  Substance and Sexual Activity   Alcohol use: No   Drug use: Not on file   Sexual activity: Not on file  Other Topics Concern   Not on file  Social History Narrative   Not on file   Social Determinants of Health   Financial Resource Strain: Not on file  Food Insecurity: Not on file  Transportation Needs: Not on file  Physical Activity: Not on file  Stress: Not on file  Social Connections: Not on file    Allergies:  Allergies  Allergen Reactions   Septra [Sulfamethoxazole-Trimethoprim] Hives    Objective:    Vital Signs:   Temp:  [97.6 F (36.4 C)] 97.6 F (36.4 C) (05/18 0934) Pulse Rate:  [120-127] 123 (05/18 1625) Resp:  [16-32] 32 (05/18 1625) BP: (104-111)/(83-86) 105/86 (05/18 1415) SpO2:  [85 %-100 %] 97 % (05/18 1625) FiO2 (%):  [40 %] 40 % (05/18 1026) Weight:  [93.9 kg] 93.9 kg (05/18 0955)    Weight change: Filed Weights   06/20/21 0955  Weight: 93.9 kg    Intake/Output:   Intake/Output Summary (Last 24 hours) at 06/20/2021 1650 Last data filed at 06/20/2021 1526 Gross per 24 hour  Intake 290.45 ml  Output --  Net 290.45 ml      Physical Exam    General:  Sitting straight up in bed. Increased WOB HEENT: normal Neck: supple. JVP to jaw  Carotids 2+ bilat; no bruits. No lymphadenopathy or thyromegaly appreciated. Cor: PMI nondisplaced. Tachy Regular rate & rhythm. No rubs, gallops or murmurs. Lungs: Decreased /crackles on 2 liters.  Abdomen: soft, nontender, nondistended. No hepatosplenomegaly. No bruits or masses. Good bowel sounds. Extremities: no cyanosis, clubbing, rash, extremities cool.  Neuro: alert & orientedx3, cranial nerves grossly intact. moves all 4 extremities w/o difficulty. Affect flat    Telemetry   ST 120s   EKG    ST   Labs   Basic Metabolic Panel: Recent Labs  Lab 06/20/21 0920 06/20/21 0928  NA 142 141  K 4.2 4.3  CL 106  --   CO2 27  --   GLUCOSE 156*  --   BUN 26*  --    CREATININE 0.89  --   CALCIUM 10.4*  --     Liver Function Tests: Recent Labs  Lab 06/20/21 1500  AST 41  ALT 49*  ALKPHOS 85  BILITOT 0.6  PROT 6.8  ALBUMIN 3.4*   No results for input(s): LIPASE, AMYLASE in the last 168 hours. No results for input(s): AMMONIA in the last 168 hours.  CBC: Recent Labs  Lab 06/20/21 0920  06/20/21 0928  WBC 24.4*  --   NEUTROABS 20.4*  --   HGB 16.2* 17.0*  HCT 52.1* 50.0*  MCV 97.7  --   PLT 247  --     Cardiac Enzymes: No results for input(s): CKTOTAL, CKMB, CKMBINDEX, TROPONINI in the last 168 hours.  BNP: BNP (last 3 results) Recent Labs    06/20/21 0920  BNP 2,324.8*    ProBNP (last 3 results) No results for input(s): PROBNP in the last 8760 hours.   CBG: No results for input(s): GLUCAP in the last 168 hours.  Coagulation Studies: No results for input(s): LABPROT, INR in the last 72 hours.   Imaging   DG Chest Port 1 View  Result Date: 06/20/2021 CLINICAL DATA:  sob EXAM: PORTABLE CHEST 1 VIEW COMPARISON:  Chest x-ray December 15, 2015. More recent priors are not available. FINDINGS: Diffuse interstitial and patchy bibasilar airspace opacities. No definite pleural effusions. No visible pneumothorax. Cardiomediastinal silhouette is within normal limits. No acute fracture. IMPRESSION: Diffuse interstitial and patchy bibasilar airspace opacities, concerning for edema and/or multifocal pneumonia. Electronically Signed   By: Feliberto Harts M.D.   On: 06/20/2021 09:38   ECHOCARDIOGRAM COMPLETE  Result Date: 06/20/2021    ECHOCARDIOGRAM REPORT   Patient Name:   Karen Perry Date of Exam: 06/20/2021 Medical Rec #:  [id number]               Height:       69.0 in Accession #:    [id number]              Weight:       207.0 lb Date of Birth:  May 01, 1965                BSA:          2.096 m Patient Age:    56 years                BP:           104/83 mmHg Patient Gender: F                       HR:           120 bpm.  Exam Location:  Inpatient Procedure: 2D Echo, Cardiac Doppler, Color Doppler and Intracardiac            Opacification Agent REPORT CONTAINS CRITICAL RESULT Indications:    R07.9* Chest pain, unspecified  History:        Patient has prior history of Echocardiogram examinations, most                 recent 02/22/2009. CHF, Abnormal ECG, Signs/Symptoms:Dyspnea and                 Shortness of Breath; Risk Factors:Hypertension and Dyslipidemia.  Sonographer:    Sheralyn Boatman RDCS Referring Phys: [id number] RONDELL A SMITH  Sonographer Comments: Technically difficult study due to poor echo windows. IMPRESSIONS  1. 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 Left ventricular ejection fraction, by estimation, is 20 to 25%. The left ventricle has severely decreased function. The  left ventricle has no regional wall motion abnormalities. There is mild left ventricular hypertrophy. Left ventricular diastolic parameters are indeterminate.  2. Right ventricular systolic function is normal. The right ventricular size is normal. There is normal pulmonary artery systolic pressure.  3. The mitral  valve is normal in structure. No evidence of mitral valve regurgitation. No evidence of mitral stenosis.  4. The aortic valve is tricuspid. There is mild calcification of the aortic valve. Aortic valve regurgitation is trivial. Aortic valve sclerosis is present, with no evidence of aortic valve stenosis.  5. The inferior vena cava is normal in size with greater than 50% respiratory variability, suggesting right atrial pressure of 3 mmHg. 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 Left ventricular ejection fraction, by estimation, is 20 to 25%. The left ventricle has severely decreased function. The left ventricle has no regional wall motion  abnormalities. Definity contrast agent was given IV to delineate the left ventricular endocardial borders. The left ventricular internal cavity size was normal in size. There is mild left ventricular hypertrophy. Left ventricular diastolic parameters are indeterminate. Right Ventricle: The right ventricular size is normal. No increase in right ventricular wall thickness. Right ventricular systolic function is normal. There is normal pulmonary artery systolic pressure. The tricuspid regurgitant velocity is 2.56 m/s, and  with an assumed right atrial pressure of 8 mmHg, the estimated right ventricular systolic pressure is 34.2 mmHg. Left Atrium: Left atrial size was normal in size. Right Atrium: Right atrial size was normal in size. Pericardium: There is no evidence of pericardial effusion. Mitral Valve: The mitral valve is normal in structure. No evidence of mitral valve regurgitation. No evidence of mitral valve stenosis. Tricuspid Valve: The tricuspid valve is normal in structure. Tricuspid valve regurgitation is mild . No evidence of tricuspid stenosis. Aortic Valve: The aortic valve is tricuspid. There is mild calcification of the aortic valve. Aortic valve regurgitation is trivial. Aortic valve sclerosis is present, with no evidence of aortic valve stenosis. Pulmonic Valve: The pulmonic valve was normal in structure. Pulmonic valve regurgitation is mild. No evidence of pulmonic stenosis. Aorta: The aortic root is normal in size and structure. Venous: The inferior vena cava is normal in size with greater than 50% respiratory variability, suggesting right atrial pressure of 3 mmHg. IAS/Shunts: No atrial level shunt detected by color flow Doppler.  LEFT VENTRICLE PLAX 2D LVIDd:         4.30 cm      Diastology LVIDs:         3.50 cm      LV e' medial:    2.83 cm/s LV PW:         1.40 cm      LV E/e' medial:  24.1 LV IVS:        1.40 cm      LV e' lateral:   9.36 cm/s LVOT diam:     1.90 cm      LV E/e' lateral: 7.3  LV SV:         30 LV SV Index:   14 LVOT Area:     2.84 cm                              3D Volume EF: LV Volumes (MOD)            3D EF:        26 % LV vol d, MOD A2C: 150.0 ml LV EDV:       175 ml LV vol d, MOD A4C: 107.0 ml LV ESV:       130 ml LV vol s, MOD  A2C: 118.0 ml LV SV:        45 ml LV vol s, MOD A4C: 94.0 ml LV SV MOD A2C:     32.0 ml LV SV MOD A4C:     107.0 ml LV SV MOD BP:      24.5 ml RIGHT VENTRICLE            IVC RV S prime:     6.96 cm/s  IVC diam: 1.20 cm TAPSE (M-mode): 1.3 cm LEFT ATRIUM             Index        RIGHT ATRIUM           Index LA diam:        2.50 cm 1.19 cm/m   RA Area:     12.30 cm LA Vol (A2C):   41.9 ml 19.99 ml/m  RA Volume:   33.30 ml  15.88 ml/m LA Vol (A4C):   46.2 ml 22.04 ml/m LA Biplane Vol: 44.9 ml 21.42 ml/m  AORTIC VALVE             PULMONIC VALVE LVOT Vmax:   80.30 cm/s  PR End Diast Vel: 1.75 msec LVOT Vmean:  60.900 cm/s LVOT VTI:    0.105 m  AORTA Ao Root diam: 2.80 cm Ao Asc diam:  3.10 cm MITRAL VALVE               TRICUSPID VALVE MV Area (PHT): 7.29 cm    TR Peak grad:   26.2 mmHg MV Decel Time: 104 msec    TR Vmax:        256.00 cm/s MV E velocity: 68.10 cm/s MV A velocity: 87.00 cm/s  SHUNTS MV E/A ratio:  0.78        Systemic VTI:  0.10 m                            Systemic Diam: 1.90 cm Charlton Haws MD Electronically signed by Charlton Haws MD Signature Date/Time: 06/20/2021/4:16:20 PM    Final      Medications:     Current Medications:  albuterol  2.5 mg Nebulization QID   atorvastatin  10 mg Oral Daily   busPIRone  10 mg Oral BID   fluticasone furoate-vilanterol  1 puff Inhalation Daily   And   umeclidinium bromide  1 puff Inhalation Daily   furosemide  40 mg Intravenous BID   sodium chloride flush  3 mL Intravenous Q12H    Infusions:  azithromycin Stopped (06/20/21 1526)   cefTRIAXone (ROCEPHIN)  IV Stopped (06/20/21 1405)   heparin 1,100 Units/hr (06/20/21 1117)      Patient Profile   Karen Perry is a 56 year old  with COPD, asthma, htn, anxiety, bipolar disorder.   Admitted with chest pain and acute respiratory failure.   Assessment/Plan   1. Shock --> Cardiogenic +/--septic  -CXR possible PNA. WBC 24. Given azithromycin + ceftriaxone.  -Procalcitonin  0.51   -Bld CX - ordered  -Echo ---> EF 20% possible Taktusubo. BNP 2324  -HS Trop Cath today - cors ok. Filling pressures mildly elevated, CO 4.6, CI 2.2  - Hypotensive in the cath lab. Started on Norepi.  - Continue IV lasix.   2. Acute Hypoxic Respiratory Failure multifactorial  -Initially on Bipap --> [place]   -Given steroids, antibiotics, nebulizer.   3. AECOPD  -See above.   4. Possible Apical Thrombus  -Resume heparin  drip.   5. DMII   6. Bipolar Disorder/Anxiety  7. H/O Seizure   Leave swan and admit to ICU CCM consulted    Length of Stay: 0  Tonye Becket, NP  06/20/2021, 4:50 PM  Advanced Heart Failure Team Pager [phone] (M-F; 7a - 5p)  Please contact [facility] for night-coverage after hours (4p -7a ) and weekends on [email or web address]  Agree with above.   56 y/o woman with bipolar d/o, COPD, DM2 presents with respiratory failure and potential shock.   Echo in ER EF 20% with potential Tako-tsubo. + LV apical clot. Markedly tachycardic.   Brought emergently to cath lab in extremis for R/L cath  Cath with normal cors. RHC with mildly elevated filling pressures. Mildly decreased output.   Starter on milrinone and NE.   Remains SOB and anxious.   General:  Ill appearing. + tachyneic HEENT: normal Neck: supple. JVP 10 Carotids 2+ bilat; no bruits. No lymphadenopathy or thryomegaly appreciated. Cor: PMI nondisplaced. Tachy regular  Lungs: + rhonchi Abdomen: soft, nontender, nondistended. No hepatosplenomegaly. No bruits or masses. Good bowel sounds. 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 Continue  milrinone and NE as needed. Follow swan numbers. Will ask CCM for assistance given severe COPD and multiple comorbidities.   CRITICAL CARE Performed by: Arvilla Meres  Total critical care time: 50 minutes  Critical care time was exclusive of separately billable procedures and treating other patients.  Critical care was necessary to treat or prevent imminent or life-threatening deterioration.  Critical care was time spent personally by me (independent of midlevel providers or residents) on the following activities: development of treatment plan with patient and/or surrogate as well as nursing, discussions with consultants, evaluation of patient's response to treatment, examination of patient, obtaining history from patient or surrogate, ordering and performing treatments and interventions, ordering and review of laboratory studies, ordering and review of radiographic studies, pulse oximetry and re-evaluation of patient's condition.  Arvilla Meres, MD  6:28 PM

## 2021-06-21 ENCOUNTER — Inpatient Hospital Stay (HOSPITAL_COMMUNITY): Payer: Medicare HMO

## 2021-06-21 ENCOUNTER — Encounter (HOSPITAL_COMMUNITY): Payer: Self-pay | Admitting: Internal Medicine

## 2021-06-21 ENCOUNTER — Other Ambulatory Visit (HOSPITAL_COMMUNITY): Payer: Self-pay

## 2021-06-21 DIAGNOSIS — I5041 Acute combined systolic (congestive) and diastolic (congestive) heart failure: Secondary | ICD-10-CM | POA: Diagnosis not present

## 2021-06-21 DIAGNOSIS — J9601 Acute respiratory failure with hypoxia: Secondary | ICD-10-CM | POA: Diagnosis not present

## 2021-06-21 LAB — BASIC METABOLIC PANEL
Anion gap: 10 (ref 5–15)
BUN: 29 mg/dL — ABNORMAL HIGH (ref 6–20)
CO2: 27 mmol/L (ref 22–32)
Calcium: 9.7 mg/dL (ref 8.9–10.3)
Chloride: 102 mmol/L (ref 98–111)
Creatinine, Ser: 0.95 mg/dL (ref 0.44–1.00)
GFR, Estimated: >60 mL/min (ref >60)
Glucose, Bld: 249 mg/dL — ABNORMAL HIGH (ref 70–99)
Potassium: 3.5 mmol/L (ref 3.5–5.1)
Sodium: 139 mmol/L (ref 135–145)

## 2021-06-21 LAB — POCT I-STAT 7, (LYTES, BLD GAS, ICA,H+H)
Acid-Base Excess: 5 mmol/L — ABNORMAL HIGH (ref 0.0–2.0)
Bicarbonate: 29.5 mmol/L — ABNORMAL HIGH (ref 20.0–28.0)
Calcium, Ion: 1.29 mmol/L (ref 1.15–1.40)
HCT: 44 % (ref 36.0–46.0)
Hemoglobin: 15 g/dL (ref 12.0–15.0)
O2 Saturation: 98 %
Patient temperature: 36.8
Potassium: 3.5 mmol/L (ref 3.5–5.1)
Sodium: 139 mmol/L (ref 135–145)
TCO2: 31 mmol/L (ref 22–32)
pCO2 arterial: 40 mmHg (ref 32–48)
pH, Arterial: 7.475 — ABNORMAL HIGH (ref 7.35–7.45)
pO2, Arterial: 105 mmHg (ref 83–108)

## 2021-06-21 LAB — TROPONIN I (HIGH SENSITIVITY): Troponin I (High Sensitivity): 585 ng/L (ref <18)

## 2021-06-21 LAB — CBC
HCT: 45.6 % (ref 36.0–46.0)
HCT: 45.2 % (ref 36.0–46.0)
Hemoglobin: 14.9 g/dL (ref 12.0–15.0)
Hemoglobin: 15.1 g/dL — ABNORMAL HIGH (ref 12.0–15.0)
MCH: 30.7 pg (ref 26.0–34.0)
MCH: 30.9 pg (ref 26.0–34.0)
MCHC: 32.7 g/dL (ref 30.0–36.0)
MCHC: 33.4 g/dL (ref 30.0–36.0)
MCV: 94 fL (ref 80.0–100.0)
MCV: 92.6 fL (ref 80.0–100.0)
Platelets: 223 10*3/uL (ref 150–400)
Platelets: 258 10*3/uL (ref 150–400)
RBC: 4.85 MIL/uL (ref 3.87–5.11)
RBC: 4.88 MIL/uL (ref 3.87–5.11)
RDW: 13.5 % (ref 11.5–15.5)
RDW: 13.5 % (ref 11.5–15.5)
WBC: 19 10*3/uL — ABNORMAL HIGH (ref 4.0–10.5)
WBC: 20.9 10*3/uL — ABNORMAL HIGH (ref 4.0–10.5)
nRBC: 0 % (ref 0.0–0.2)
nRBC: 0 % (ref 0.0–0.2)

## 2021-06-21 LAB — HEPARIN LEVEL (UNFRACTIONATED)
Heparin Unfractionated: <0.1 IU/mL — ABNORMAL LOW (ref 0.30–0.70)
Heparin Unfractionated: 0.63 IU/mL (ref 0.30–0.70)

## 2021-06-21 LAB — HEMOGLOBIN A1C
Hgb A1c MFr Bld: 5.6 % (ref 4.8–5.6)
Mean Plasma Glucose: 114.02 mg/dL

## 2021-06-21 LAB — COOXEMETRY PANEL
Carboxyhemoglobin: 1.7 % — ABNORMAL HIGH (ref 0.5–1.5)
Methemoglobin: <0.7 % (ref 0.0–1.5)
O2 Saturation: 80.3 %
Total hemoglobin: 14.9 g/dL (ref 12.0–16.0)

## 2021-06-21 LAB — MAGNESIUM: Magnesium: 2.1 mg/dL (ref 1.7–2.4)

## 2021-06-21 LAB — LACTIC ACID, PLASMA: Lactic Acid, Venous: 1.5 mmol/L (ref 0.5–1.9)

## 2021-06-21 MED ORDER — DIGOXIN 125 MCG PO TABS
0.1250 mg | ORAL_TABLET | Freq: Every day | ORAL | Status: DC
  Administered 2021-06-21 – 2021-06-27 (×6): 0.125 mg via ORAL
  Filled 2021-06-21 (×6): qty 1

## 2021-06-21 MED ORDER — METHYLPREDNISOLONE SODIUM SUCC 40 MG IJ SOLR
40.0000 mg | INTRAMUSCULAR | Status: AC
  Administered 2021-06-21 – 2021-06-23 (×3): 40 mg via INTRAVENOUS
  Filled 2021-06-21 (×3): qty 1

## 2021-06-21 MED ORDER — DOCUSATE SODIUM 100 MG PO CAPS
100.0000 mg | ORAL_CAPSULE | Freq: Every day | ORAL | Status: DC
  Administered 2021-06-22: 100 mg via ORAL
  Filled 2021-06-21 (×2): qty 1

## 2021-06-21 MED ORDER — POTASSIUM CHLORIDE 20 MEQ PO PACK
20.0000 meq | PACK | Freq: Once | ORAL | Status: AC
  Administered 2021-06-21: 20 meq via ORAL
  Filled 2021-06-21: qty 1

## 2021-06-21 MED ORDER — NOREPINEPHRINE 16 MG/250ML-% IV SOLN
0.0000 ug/min | INTRAVENOUS | Status: DC
  Administered 2021-06-21: 24 ug/min via INTRAVENOUS
  Administered 2021-06-21: 22 ug/min via INTRAVENOUS
  Administered 2021-06-24: 6 ug/min via INTRAVENOUS
  Filled 2021-06-21 (×4): qty 250

## 2021-06-21 MED ORDER — SPIRONOLACTONE 12.5 MG HALF TABLET
12.5000 mg | ORAL_TABLET | Freq: Every day | ORAL | Status: DC
  Administered 2021-06-21: 12.5 mg via ORAL
  Filled 2021-06-21: qty 1

## 2021-06-21 MED ORDER — SODIUM CHLORIDE 0.9 % IV SOLN
2.0000 g | INTRAVENOUS | Status: DC
  Administered 2021-06-22 – 2021-06-23 (×2): 2 g via INTRAVENOUS
  Filled 2021-06-21 (×3): qty 20

## 2021-06-21 MED ORDER — SODIUM CHLORIDE 0.9 % IV SOLN
500.0000 mg | INTRAVENOUS | Status: DC
  Administered 2021-06-21 – 2021-06-23 (×3): 500 mg via INTRAVENOUS
  Filled 2021-06-21 (×5): qty 5

## 2021-06-21 MED ORDER — HEPARIN BOLUS VIA INFUSION
2000.0000 [IU] | Freq: Once | INTRAVENOUS | Status: AC
  Administered 2021-06-21: 2000 [IU] via INTRAVENOUS
  Filled 2021-06-21: qty 2000

## 2021-06-21 MED ORDER — POLYETHYLENE GLYCOL 3350 17 G PO PACK
17.0000 g | PACK | Freq: Every day | ORAL | Status: DC
  Administered 2021-06-22: 17 g via ORAL
  Filled 2021-06-21 (×2): qty 1

## 2021-06-21 MED FILL — Heparin Sodium (Porcine) Inj 1000 Unit/ML: INTRAMUSCULAR | Qty: 10 | Status: AC

## 2021-06-21 MED FILL — Fentanyl Citrate Preservative Free (PF) Inj 100 MCG/2ML: INTRAMUSCULAR | Qty: 2 | Status: AC

## 2021-06-21 NOTE — Progress Notes (Signed)
ANTICOAGULATION CONSULT NOTE - Follow Up Consult  Pharmacy Consult for Heparin infusion Indication: chest pain/ACS, LV apical thrombus  Allergies  Allergen Reactions   Septra [Sulfamethoxazole-Trimethoprim] Hives    Patient Measurements: Height: 5\' 9"  (175.3 cm) Weight: 76.8 kg (169 lb 5 oz) IBW/kg (Calculated) : 66.2 Heparin Dosing Weight: 86.1 kg  Vital Signs: Temp: 98.4 F (36.9 C) (05/19 1400) Temp Source: Core (05/19 0800) BP: 127/84 (05/19 1400) Pulse Rate: 110 (05/19 1400)  Labs: Recent Labs    06/20/21 0920 06/20/21 0928 06/20/21 2325 06/20/21 2342 06/21/21 0635 06/21/21 0702 06/21/21 1010  HGB 16.2*   < >  --    < > 14.9 15.0 15.1*  HCT 52.1*   < >  --    < > 45.6 44.0 45.2  PLT 247  --   --   --  223  --  258  HEPARINUNFRC  --   --   --   --  <0.10*  --   --   CREATININE 0.89  --   --   --  0.95  --   --   TROPONINIHS 760*  --  585*  --   --   --   --    < > = values in this interval not displayed.     Estimated Creatinine Clearance: 69.9 mL/min (by C-G formula based on SCr of 0.95 mg/dL).   Medical History: Past Medical History:  Diagnosis Date   Asthma    Bipolar 1 disorder (HCC)    Diabetes mellitus without complication (HCC)    Hypertension    Seizures (HCC)     Medications:  Medications Prior to Admission  Medication Sig Dispense Refill Last Dose   albuterol (PROVENTIL) (2.5 MG/3ML) 0.083% nebulizer solution USE 1 VIAL IN NEBULIZER EVERY 6 HOURS AS NEEDED FOR WHEEZING   06/19/2021   atorvastatin (LIPITOR) 10 MG tablet Take 10 mg by mouth daily.   06/19/2021   busPIRone (BUSPAR) 10 MG tablet Take 10 mg by mouth 2 (two) times daily.   06/19/2021   lamoTRIgine (LAMICTAL) 100 MG tablet Take 200 mg by mouth daily.   06/19/2021   losartan (COZAAR) 25 MG tablet Take 25 mg by mouth daily.   06/19/2021   metoprolol tartrate (LOPRESSOR) 25 MG tablet Take 25 mg by mouth daily.   06/19/2021 at 2000   montelukast (SINGULAIR) 10 MG tablet Take 10 mg by  mouth at bedtime.   06/19/2021   tiZANidine (ZANAFLEX) 2 MG tablet Take 2 mg by mouth every 8 (eight) hours as needed.   06/19/2021   TRELEGY ELLIPTA 100-62.5-25 MCG/ACT AEPB Take 1 puff by mouth daily.   06/19/2021    Assessment: 56 yo F presented to the ED with symptoms of SOB and found to have O2 Sat 85% on RA improved to 100% on 10L NRB. Pt placed on BiPAP. BNP 2324.8 and troponin 760. Pt was not taking any anticoagulation prior to admission. Pharmacy consulted to dose heparin as per ACS protocol and for potential LV apical thrombus.   Most recent heparin level is subtherapeutic at <0.1. No issues noted with infusion or bleeding per RN.  Goal of Therapy:  Heparin level 0.3-0.7 units/ml Monitor platelets by anticoagulation protocol: Yes   Plan:  Heparin 2000 units/hr x1 followed by heparin 1500 units/hr  8 hour heparin level Daily CBC Monitor for s/sx of bleeding  Thank you for including pharmacy in the care of this patient.  54, PharmD PGY1 Acute Care Pharmacy  Resident  Phone: [phone] 06/21/2021  2:59 PM  Please check [email or web address] for unit-specific pharmacy phone numbers.

## 2021-06-21 NOTE — Progress Notes (Signed)
Patient awake asking for water.  Patient weaned from BiPAP to 3L [place]. VSS.  BiPAP on standby at bedside.  RT will continue to monitor.

## 2021-06-21 NOTE — Progress Notes (Signed)
Heart Failure Navigator Progress Note  Assessed for Heart & Vascular TOC clinic readiness.  Patient does not meet criteria due to patient with Dr. Gala Romney.    Rhae Hammock, BSN, Scientist, clinical (histocompatibility and immunogenetics) Only

## 2021-06-21 NOTE — Progress Notes (Signed)
ANTICOAGULATION CONSULT NOTE - Follow Up Consult  Pharmacy Consult for Heparin infusion Indication: chest pain/ACS, LV apical thrombus  Allergies  Allergen Reactions   Septra [Sulfamethoxazole-Trimethoprim] Hives    Patient Measurements: Height: 5\' 9"  (175.3 cm) Weight: 76.8 kg (169 lb 5 oz) IBW/kg (Calculated) : 66.2 Heparin Dosing Weight: 86.1 kg  Vital Signs: Temp: 99 F (37.2 C) (05/19 1800) BP: 135/79 (05/19 1900) Pulse Rate: 107 (05/19 1900)  Labs: Recent Labs    06/20/21 0920 06/20/21 0928 06/20/21 2325 06/20/21 2342 06/21/21 0635 06/21/21 0702 06/21/21 1010 06/21/21 1808  HGB 16.2*   < >  --    < > 14.9 15.0 15.1*  --   HCT 52.1*   < >  --    < > 45.6 44.0 45.2  --   PLT 247  --   --   --  223  --  258  --   HEPARINUNFRC  --   --   --   --  <0.10*  --   --  0.63  CREATININE 0.89  --   --   --  0.95  --   --   --   TROPONINIHS 760*  --  585*  --   --   --   --   --    < > = values in this interval not displayed.     Estimated Creatinine Clearance: 69.9 mL/min (by C-G formula based on SCr of 0.95 mg/dL).   Medical History: Past Medical History:  Diagnosis Date   Asthma    Bipolar 1 disorder (HCC)    Diabetes mellitus without complication (HCC)    Hypertension    Seizures (HCC)     Medications:  Medications Prior to Admission  Medication Sig Dispense Refill Last Dose   albuterol (PROVENTIL) (2.5 MG/3ML) 0.083% nebulizer solution USE 1 VIAL IN NEBULIZER EVERY 6 HOURS AS NEEDED FOR WHEEZING   06/19/2021   atorvastatin (LIPITOR) 10 MG tablet Take 10 mg by mouth daily.   06/19/2021   busPIRone (BUSPAR) 10 MG tablet Take 10 mg by mouth 2 (two) times daily.   06/19/2021   lamoTRIgine (LAMICTAL) 100 MG tablet Take 200 mg by mouth daily.   06/19/2021   losartan (COZAAR) 25 MG tablet Take 25 mg by mouth daily.   06/19/2021   metoprolol tartrate (LOPRESSOR) 25 MG tablet Take 25 mg by mouth daily.   06/19/2021 at 2000   montelukast (SINGULAIR) 10 MG tablet Take 10  mg by mouth at bedtime.   06/19/2021   tiZANidine (ZANAFLEX) 2 MG tablet Take 2 mg by mouth every 8 (eight) hours as needed.   06/19/2021   TRELEGY ELLIPTA 100-62.5-25 MCG/ACT AEPB Take 1 puff by mouth daily.   06/19/2021    Assessment: 56 yo F presented to the ED with symptoms of SOB and found to have O2 Sat 85% on RA improved to 100% on 10L NRB. Pt placed on BiPAP. BNP 2324.8 and troponin 760. Pt was not taking any anticoagulation prior to admission. Pharmacy consulted to dose heparin as per ACS protocol and for potential LV apical thrombus.   Heparin level this evening is 0.63; within goal range.  No overt bleeding or complications noted.  Goal of Therapy:  Heparin level 0.3-0.7 units/ml Monitor platelets by anticoagulation protocol: Yes   Plan:  Continue IV heparin at 1500 units/hr. Daily CBC and heparin level. Monitor for s/sx of bleeding  Thank you for including pharmacy in the care of this patient.  Reece Leader, Colon Flattery, BCCP Clinical Pharmacist  06/21/2021 8:34 PM   [facility] pharmacy phone numbers are listed on [email or web address]

## 2021-06-21 NOTE — Progress Notes (Signed)
[facility] Progress Note Patient Name: Karen Perry DOB: Dec 24, 1965 MRN: [id number]   Date of Service  06/21/2021  HPI/Events of Note  Nursing reports that A-line has come out and requests to replace A-line.  eICU Interventions  Plan: RT to place new A-line.     Intervention Category Major Interventions: Other:  Wanisha Shiroma Cornelia Copa 06/21/2021, 4:04 AM

## 2021-06-21 NOTE — Procedures (Signed)
Arterial Catheter Insertion Procedure Note  JOVONDA SELNER  [id number]  06/04/1965  Date:06/21/21  Time:6:13 AM    Provider Performing: Darcella Gasman Jordis Repetto    Procedure: Insertion of Arterial Line (70623) with US guidance ([id number])   Indication(s) Blood pressure monitoring and/or need for frequent ABGs  Consent Risks of the procedure as well as the alternatives and risks of each were explained to the patient and/or caregiver.  Consent for the procedure was obtained and is signed in the bedside chart  Anesthesia None   Time Out Verified patient identification, verified procedure, site/side was marked, verified correct patient position, special equipment/implants available, medications/allergies/relevant history reviewed, required imaging and test results available.   Sterile Technique Maximal sterile technique including full sterile barrier drape, hand hygiene, sterile gown, sterile gloves, mask, hair covering, sterile ultrasound probe cover (if used).   Procedure Description Area of catheter insertion was cleaned with chlorhexidine and draped in sterile fashion. With real-time ultrasound guidance an arterial catheter was placed into the left  axillary  artery.  Appropriate arterial tracings confirmed on monitor.     Complications/Tolerance None; patient tolerated the procedure well.   EBL Minimal   Specimen(s) None  Darcella Gasman Curtiss Mahmood, PA-C

## 2021-06-21 NOTE — Consult Note (Signed)
NAME:  Karen Perry, MRN:  [id number], DOB:  04-09-1965, LOS: 1 ADMISSION DATE:  06/20/2021, CONSULTATION DATE:  06/20/21 REFERRING MD:  Bensimhon - Adv HF, CHIEF COMPLAINT:  SOB   History of Present Illness:   56 yo F PMH COPD, anxiety, bipolar disorder, HTN, seizures presented to ED 5/18 with SOB and chest tightness. She was placed on Bipap in ED with concern for pulm edema, given steroids, rocephin and azithro for possible AECOPD.   ECHO revealed EF 20-25%-- with concern for possible ischemic disease, taken urgently for cath. Received lasix in ED.   In cath lab, pt on [place]. Developed worsening SOB during case.  PCCM consulted in this setting   Pertinent  Medical History  COPD Bipolar Seizure HTN Anxiety  Significant Hospital Events: Including procedures, antibiotic start and stop dates in addition to other pertinent events   5/18 admit to [facility] SOB -- concerning for acute systolic HF, pulm edema.  Taken to cath lab for concern for ischemia. Worse resp status, PCCM consult  Interim History / Subjective:  No overnight issues. Tolerated bipap and now back on nasal cannula. PA catheter left in. No wheezing. Feels sob.   Objective   Blood pressure 107/82, pulse (!) 102, temperature 98.2 F (36.8 C), temperature source Core, resp. rate 16, height 5\' 9"  (1.753 m), weight 76.8 kg, last menstrual period 10/16/2014, SpO2 97 %. PAP: (34-54)/(26-42) 35/29 CVP:  [9 mmHg-24 mmHg] 10 mmHg PCWP:  [34 mmHg-39 mmHg] 34 mmHg CO:  [5.1 L/min-5.4 L/min] 5.1 L/min CI:  [2.9 L/min/m2-3.2 L/min/m2] 2.9 L/min/m2  Vent Mode: PCV;BIPAP FiO2 (%):  [40 %-50 %] 50 % Set Rate:  [14 bmp] 14 bmp PEEP:  [5 cmH20] 5 cmH20   Intake/Output Summary (Last 24 hours) at 06/21/2021 1011 Last data filed at 06/21/2021 0800 Gross per 24 hour  Intake 1243.21 ml  Output 1000 ml  Net 243.21 ml    Filed Weights   06/20/21 0955 06/20/21 1911 06/21/21 0500  Weight: 93.9 kg 76.5 kg 76.8 kg     Examination: Resting comfortably no distress On nasal cannula PA catheter in place Breath sounds diminished bilaterally, no wheezes   Resolved Hospital Problem list     Assessment & Plan:   Acute hypoxemic respiratory failure COPD with acute exacerbation P -bipap qhs and prn -nebs, steroids. Will titrate down steroids today -diuresis increased today - can allow her to eat today -azithro, rocephin x 5 days given elevated PCT  Acute decompensated HFrEF exacerbation EF 20% Cardiogenic shock  -normal cors. ?takotsubo  P -Adv HF managing -- changing NE to milrinone  -trend coox, lactic as needed - PA catheter in place, PAOP elevated  Anxiety Bipolar disorder  P - PRN anxiolysis  -on lamictal outpt -- holding while NPO, restart when appropriate   Leukocytosis  - blood cultures and sputum cultures pending  Best Practice (right click and "Reselect all SmartList Selections" daily)   Diet/type: NPO - can probably advance diet today since she's no longer on bipap DVT prophylaxis: systemic heparin GI prophylaxis: N/A Lines: Central line arterial line  Foley:  N/A Code Status:  full code Last date of multidisciplinary goals of care discussion [per primary]  Labs   CBC: Recent Labs  Lab 06/20/21 0920 06/20/21 0928 06/20/21 1733 06/20/21 1738 06/20/21 2342 06/21/21 0635 06/21/21 0702  WBC 24.4*  --   --   --   --  19.0*  --   NEUTROABS 20.4*  --   --   --   --   --   --  HGB 16.2*   < > 16.0* 16.0* 15.3* 14.9 15.0  HCT 52.1*   < > 47.0* 47.0* 45.0 45.6 44.0  MCV 97.7  --   --   --   --  94.0  --   PLT 247  --   --   --   --  223  --    < > = values in this interval not displayed.     Basic Metabolic Panel: Recent Labs  Lab 06/20/21 0920 06/20/21 0928 06/20/21 1733 06/20/21 1738 06/20/21 2342 06/21/21 0635 06/21/21 0702  NA 142   < > 142 142 140 139 139  K 4.2   < > 3.3* 3.4* 3.3* 3.5 3.5  CL 106  --   --   --   --  102  --   CO2 27  --   --    --   --  27  --   GLUCOSE 156*  --   --   --   --  249*  --   BUN 26*  --   --   --   --  29*  --   CREATININE 0.89  --   --   --   --  0.95  --   CALCIUM 10.4*  --   --   --   --  9.7  --    < > = values in this interval not displayed.    GFR: Estimated Creatinine Clearance: 69.9 mL/min (by C-G formula based on SCr of 0.95 mg/dL). Recent Labs  Lab 06/20/21 0920 06/20/21 1500 06/20/21 2325 06/21/21 0635  PROCALCITON  --  0.51  --   --   WBC 24.4*  --   --  19.0*  LATICACIDVEN  --   --  1.5  --      Liver Function Tests: Recent Labs  Lab 06/20/21 1500  AST 41  ALT 49*  ALKPHOS 85  BILITOT 0.6  PROT 6.8  ALBUMIN 3.4*    No results for input(s): LIPASE, AMYLASE in the last 168 hours. No results for input(s): AMMONIA in the last 168 hours.  ABG    Component Value Date/Time   PHART 7.475 (H) 06/21/2021 0702   PCO2ART 40.0 06/21/2021 0702   PO2ART 105 06/21/2021 0702   HCO3 29.5 (H) 06/21/2021 0702   TCO2 31 06/21/2021 0702   ACIDBASEDEF 2.8 (H) 02/20/2009 2215   O2SAT 98 06/21/2021 0702      Coagulation Profile: No results for input(s): INR, PROTIME in the last 168 hours.  Cardiac Enzymes: No results for input(s): CKTOTAL, CKMB, CKMBINDEX, TROPONINI in the last 168 hours.  HbA1C: No results found for: HGBA1C  CBG: No results for input(s): GLUCAP in the last 168 hours.

## 2021-06-21 NOTE — Progress Notes (Signed)
Advanced Heart Failure Rounding Note   Subjective:     Remains on milrinone 0.125 and NE 23.   Still SOB but feeling some better. Denies CP. Lactate has cleared.   CXR is clearing   PAP: (34-54)/(26-42) 34/28 CVP:  [9 mmHg-24 mmHg] 9 mmHg PCWP:  [39 mmHg] 39 mmHg CO:  [5.4 L/min] 5.4 L/min CI:  [3.2 L/min/m2] 3.2 L/min/m2 SVR 1100   Objective:   Weight Range:  Vital Signs:   Temp:  [97.6 F (36.4 C)-99.5 F (37.5 C)] 98.2 F (36.8 C) (05/19 0500) Pulse Rate:  [89-134] 109 (05/19 0500) Resp:  [16-38] 18 (05/19 0500) BP: (96-160)/(67-139) 104/75 (05/19 0500) SpO2:  [84 %-100 %] 96 % (05/19 0500) Arterial Line BP: (77-113)/(47-75) 77/74 (05/19 0300) FiO2 (%):  [40 %-50 %] 50 % (05/18 2200) Weight:  [76.5 kg-93.9 kg] 76.5 kg (05/18 1911) Last BM Date :  (pta)  Weight change: Filed Weights   06/20/21 0955 06/20/21 1911  Weight: 93.9 kg 76.5 kg    Intake/Output:   Intake/Output Summary (Last 24 hours) at 06/21/2021 0720 Last data filed at 06/21/2021 0400 Gross per 24 hour  Intake 945.74 ml  Output 400 ml  Net 545.74 ml     Physical Exam: General:  Sitting up in bed On NRB HEENT: normal Neck: supple. RIJ swan . Carotids 2+ bilat; no bruits. No lymphadenopathy or thryomegaly appreciated. Cor: PMI nondisplaced. Regular tachy Lungs: coarse Abdomen: soft, nontender, nondistended. No hepatosplenomegaly. No bruits or masses. Good bowel sounds. Extremities: no cyanosis, clubbing, rash, edema Neuro: alert & orientedx3, cranial nerves grossly intact. moves all 4 extremities w/o difficulty. Affect pleasant  Telemetry: Sinus tach 100-120. Personally reviewed   Labs: Basic Metabolic Panel: Recent Labs  Lab 06/20/21 0920 06/20/21 0928 06/20/21 1733 06/20/21 1738 06/20/21 2342 06/21/21 0702  NA 142 141 142 142 140 139  K 4.2 4.3 3.3* 3.4* 3.3* 3.5  CL 106  --   --   --   --   --   CO2 27  --   --   --   --   --   GLUCOSE 156*  --   --   --   --   --    BUN 26*  --   --   --   --   --   CREATININE 0.89  --   --   --   --   --   CALCIUM 10.4*  --   --   --   --   --     Liver Function Tests: Recent Labs  Lab 06/20/21 1500  AST 41  ALT 49*  ALKPHOS 85  BILITOT 0.6  PROT 6.8  ALBUMIN 3.4*   No results for input(s): LIPASE, AMYLASE in the last 168 hours. No results for input(s): AMMONIA in the last 168 hours.  CBC: Recent Labs  Lab 06/20/21 0920 06/20/21 0928 06/20/21 1733 06/20/21 1738 06/20/21 2342 06/21/21 0635 06/21/21 0702  WBC 24.4*  --   --   --   --  19.0*  --   NEUTROABS 20.4*  --   --   --   --   --   --   HGB 16.2*   < > 16.0* 16.0* 15.3* 14.9 15.0  HCT 52.1*   < > 47.0* 47.0* 45.0 45.6 44.0  MCV 97.7  --   --   --   --  94.0  --   PLT 247  --   --   --   --  223  --    < > = values in this interval not displayed.    Cardiac Enzymes: No results for input(s): CKTOTAL, CKMB, CKMBINDEX, TROPONINI in the last 168 hours.  BNP: BNP (last 3 results) Recent Labs    06/20/21 0920  BNP 2,324.8*    ProBNP (last 3 results) No results for input(s): PROBNP in the last 8760 hours.    Other results:  Imaging: CARDIAC CATHETERIZATION  Result Date: 06/20/2021 Findings: Ao = 112/82 (96) LV = 107/34 RA = 10 RV = 41/14 PA = 47/28 (37) PCW = 21 Fick cardiac output/index = 5.0/2.4 Thermo CO/CI = 4.6/2.2 PVR = 3.2 WU SVR = 1,440 FA sat = 95% PA sat = 70%, 71 Assessment: 1. Normal cors 2. Severe NICM EF 20% 3. Elevated filling pressures with normal cardiac output 4. LV apical thrombus on echo Plan/Discussion: Will leave swan in. Titrate NE and milrinone as needed. Diurese. Heparin for LV clot. CCM to co-manage. Arvilla Meres, MD 6:38 PM   DG CHEST PORT 1 VIEW  Result Date: 06/21/2021 CLINICAL DATA:  56 year old female with history of shortness of breath. EXAM: PORTABLE CHEST 1 VIEW COMPARISON:  Chest x-ray 06/20/2021. FINDINGS: Right IJ Cordis through which a Swan-Ganz catheter has been passed into the distal aspect  of the right main pulmonary artery. Lung volumes are low. Extensive bibasilar opacities may reflect areas of atelectasis and/or consolidation with superimposed small bilateral pleural effusions. No pneumothorax. Mild indistinctness of interstitial markings. Pulmonary vasculature remains mildly engorged, but less so than the recent prior study. Mild cardiomegaly. Upper mediastinal contours are within normal limits. IMPRESSION: 1. Findings suggest resolving congestive heart failure, as above. 2. Persistent bibasilar opacities which may reflect areas of atelectasis and/or consolidation, with persistent small bilateral pleural effusions. 3. Support apparatus, as above. Electronically Signed   By: Trudie Reed M.D.   On: 06/21/2021 07:00   Port CXR  Result Date: 06/20/2021 CLINICAL DATA:  Central line placement. EXAM: PORTABLE CHEST 1 VIEW COMPARISON:  Chest x-ray 06/20/2021 FINDINGS: There is a new Swan-Ganz catheter with distal tip projecting over the right main pulmonary artery. The cardiac silhouette is mildly enlarged, unchanged. There is central pulmonary vascular congestion. Bilateral lower lung airspace disease has not significantly changed. There is no pneumothorax or acute fracture. IMPRESSION: 1. New Swan-Ganz catheter tip projects over the right main pulmonary artery. 2. Stable bilateral lower lobe airspace disease. Electronically Signed   By: Darliss Cheney M.D.   On: 06/20/2021 20:15   DG Chest Port 1 View  Result Date: 06/20/2021 CLINICAL DATA:  sob EXAM: PORTABLE CHEST 1 VIEW COMPARISON:  Chest x-ray December 15, 2015. More recent priors are not available. FINDINGS: Diffuse interstitial and patchy bibasilar airspace opacities. No definite pleural effusions. No visible pneumothorax. Cardiomediastinal silhouette is within normal limits. No acute fracture. IMPRESSION: Diffuse interstitial and patchy bibasilar airspace opacities, concerning for edema and/or multifocal pneumonia. Electronically  Signed   By: Feliberto Harts M.D.   On: 06/20/2021 09:38   ECHOCARDIOGRAM COMPLETE  Result Date: 06/20/2021    ECHOCARDIOGRAM REPORT   Patient Name:   Karen Perry Date of Exam: 06/20/2021 Medical Rec #:  [id number]               Height:       69.0 in Accession #:    [id number]              Weight:       207.0 lb Date of Birth:  09/10/1965  BSA:          2.096 m Patient Age:    56 years                BP:           104/83 mmHg Patient Gender: F                       HR:           120 bpm. Exam Location:  Inpatient Procedure: 2D Echo, Cardiac Doppler, Color Doppler and Intracardiac            Opacification Agent REPORT CONTAINS CRITICAL RESULT Indications:    R07.9* Chest pain, unspecified  History:        Patient has prior history of Echocardiogram examinations, most                 recent 02/22/2009. CHF, Abnormal ECG, Signs/Symptoms:Dyspnea and                 Shortness of Breath; Risk Factors:Hypertension and Dyslipidemia.  Sonographer:    Sheralyn Boatman RDCS Referring Phys: [id number] RONDELL A SMITH  Sonographer Comments: Technically difficult study due to poor echo windows. IMPRESSIONS  1. 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 Left ventricular ejection fraction, by estimation, is 20 to 25%. The left ventricle has severely decreased function. The  left ventricle has no regional wall motion abnormalities. There is mild left ventricular hypertrophy. Left ventricular diastolic parameters are indeterminate.  2. Right ventricular systolic function is normal. The right ventricular size is normal. There is normal pulmonary artery systolic pressure.  3. The mitral valve is normal in structure. No evidence of mitral valve regurgitation. No evidence of mitral stenosis.  4. The aortic valve is tricuspid. There is mild calcification of the aortic valve. Aortic valve regurgitation is trivial. Aortic valve sclerosis  is present, with no evidence of aortic valve stenosis.  5. The inferior vena cava is normal in size with greater than 50% respiratory variability, suggesting right atrial pressure of 3 mmHg. 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 Left ventricular ejection fraction, by estimation, is 20 to 25%. The left ventricle has severely decreased function. The left ventricle has no regional wall motion abnormalities. Definity contrast agent was given IV to delineate the left ventricular endocardial borders. The left ventricular internal cavity size was normal in size. There is mild left ventricular hypertrophy. Left ventricular diastolic parameters are indeterminate. Right Ventricle: The right ventricular size is normal. No increase in right ventricular wall thickness. Right ventricular systolic function is normal. There is normal pulmonary artery systolic pressure. The tricuspid regurgitant velocity is 2.56 m/s, and  with an assumed right atrial pressure of 8 mmHg, the estimated right ventricular systolic pressure is 34.2 mmHg. Left Atrium: Left atrial size was normal in size. Right Atrium: Right atrial size was normal in size. Pericardium: There is no evidence of pericardial effusion. Mitral Valve: The mitral valve is normal in structure. No evidence of mitral valve regurgitation. No evidence of mitral valve stenosis. Tricuspid Valve: The tricuspid valve is normal in structure. Tricuspid valve regurgitation is mild . No evidence of tricuspid stenosis. Aortic Valve: The aortic valve is tricuspid. There is mild calcification of the aortic valve. Aortic valve regurgitation is trivial. Aortic valve sclerosis is present,  with no evidence of aortic valve stenosis. Pulmonic Valve: The pulmonic valve was normal in structure. Pulmonic valve regurgitation is mild. No evidence of pulmonic stenosis. Aorta: The aortic root  is normal in size and structure. Venous: The inferior vena cava is normal in size with greater than 50% respiratory variability, suggesting right atrial pressure of 3 mmHg. IAS/Shunts: No atrial level shunt detected by color flow Doppler.  LEFT VENTRICLE PLAX 2D LVIDd:         4.30 cm      Diastology LVIDs:         3.50 cm      LV e' medial:    2.83 cm/s LV PW:         1.40 cm      LV E/e' medial:  24.1 LV IVS:        1.40 cm      LV e' lateral:   9.36 cm/s LVOT diam:     1.90 cm      LV E/e' lateral: 7.3 LV SV:         30 LV SV Index:   14 LVOT Area:     2.84 cm                              3D Volume EF: LV Volumes (MOD)            3D EF:        26 % LV vol d, MOD A2C: 150.0 ml LV EDV:       175 ml LV vol d, MOD A4C: 107.0 ml LV ESV:       130 ml LV vol s, MOD A2C: 118.0 ml LV SV:        45 ml LV vol s, MOD A4C: 94.0 ml LV SV MOD A2C:     32.0 ml LV SV MOD A4C:     107.0 ml LV SV MOD BP:      24.5 ml RIGHT VENTRICLE            IVC RV S prime:     6.96 cm/s  IVC diam: 1.20 cm TAPSE (M-mode): 1.3 cm LEFT ATRIUM             Index        RIGHT ATRIUM           Index LA diam:        2.50 cm 1.19 cm/m   RA Area:     12.30 cm LA Vol (A2C):   41.9 ml 19.99 ml/m  RA Volume:   33.30 ml  15.88 ml/m LA Vol (A4C):   46.2 ml 22.04 ml/m LA Biplane Vol: 44.9 ml 21.42 ml/m  AORTIC VALVE             PULMONIC VALVE LVOT Vmax:   80.30 cm/s  PR End Diast Vel: 1.75 msec LVOT Vmean:  60.900 cm/s LVOT VTI:    0.105 m  AORTA Ao Root diam: 2.80 cm Ao Asc diam:  3.10 cm MITRAL VALVE               TRICUSPID VALVE MV Area (PHT): 7.29 cm    TR Peak grad:   26.2 mmHg MV Decel Time: 104 msec    TR Vmax:        256.00 cm/s MV E velocity: 68.10 cm/s MV A velocity: 87.00 cm/s  SHUNTS MV E/A ratio:  0.78  Systemic VTI:  0.10 m                            Systemic Diam: 1.90 cm Charlton Haws MD Electronically signed by Charlton Haws MD Signature Date/Time: 06/20/2021/4:16:20 PM    Final      Medications:     Scheduled Medications:   arformoterol  15 mcg Nebulization BID   atorvastatin  10 mg Oral Daily   budesonide (PULMICORT) nebulizer solution  0.5 mg Nebulization BID   busPIRone  10 mg Oral BID   Chlorhexidine Gluconate Cloth  6 each Topical Daily   furosemide  40 mg Intravenous BID   mouth rinse  15 mL Mouth Rinse BID   methylPREDNISolone (SOLU-MEDROL) injection  80 mg Intravenous Q12H   revefenacin  175 mcg Nebulization Daily   sodium chloride flush  3 mL Intravenous Q12H   sodium chloride flush  3 mL Intravenous Q12H    Infusions:  sodium chloride     sodium chloride     sodium chloride Stopped (06/20/21 2113)   sodium chloride 10 mL/hr at 06/21/21 0400   azithromycin Stopped (06/20/21 1526)   cefTRIAXone (ROCEPHIN)  IV Stopped (06/20/21 1400)   heparin 1,300 Units/hr (06/21/21 0400)   milrinone 0.125 mcg/kg/min (06/21/21 0400)   norepinephrine (LEVOPHED) Adult infusion 23 mcg/min (06/21/21 0656)    PRN Medications: sodium chloride, sodium chloride, acetaminophen, ipratropium-albuterol, LORazepam, ondansetron (ZOFRAN) IV, sodium chloride flush   Assessment/Plan:   1. Shock --> Cardiogenic +/--septic  -CXR possible PNA. WBC 24. Given azithromycin + ceftriaxone.  -Procalcitonin  0.51   -Bld CX - ordered  -Echo ---> EF 20% possible Taktusubo. BNP 2324 hs trop 585 -Cath 5/18 - Normal cors Filling pressures mildly elevated, CO 4.6, CI 2.2  - Hypotensive in the cath lab. On NE - lactate has cleared. Wean NE as tolerated  2. Acute systolic HF due to NICM - cath 5/18 normal cors. 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 Co-ox 80% - volume status improving. Continue diuresis.  - Continue milrinone and NE. Wean NE as tolerated - Add dig and spiro - eventual cMRI when respiratory status better  3 Acute Hypoxic Respiratory Failure multifactorial (HF/AECOPD) -Initially on Bipap --> [place]   -Given steroids, antibiotics, nebulizer.  - CCM following - severe underlying COPD (FEV1 24%)  4.  Probable Apical Thrombus  -Continue heparin   5. DMII  - SSI - Add SGLT2i soon  6. Bipolar Disorder/Anxiety  - takes prn Xanax at home  7. H/O Seizure   - follow  CRITICAL CARE Performed by: Arvilla Meres  Total critical care time: 35 minutes  Critical care time was exclusive of separately billable procedures and treating other patients.  Critical care was necessary to treat or prevent imminent or life-threatening deterioration.  Critical care was time spent personally by me (independent of midlevel providers or residents) on the following activities: development of treatment plan with patient and/or surrogate as well as nursing, discussions with consultants, evaluation of patient's response to treatment, examination of patient, obtaining history from patient or surrogate, ordering and performing treatments and interventions, ordering and review of laboratory studies, ordering and review of radiographic studies, pulse oximetry and re-evaluation of patient's condition.   Length of Stay: 1   Arvilla Meres MD 06/21/2021, 7:20 AM  Advanced Heart Failure Team Pager [phone] (M-F; 7a - 4p)  Please contact [facility] for night-coverage after hours (4p -7a ) and weekends on  [email or web address]

## 2021-06-21 NOTE — Progress Notes (Incomplete)
ANTICOAGULATION CONSULT NOTE - Follow Up Consult  Pharmacy Consult for *** Indication: {Indications:3041533}  Allergies  Allergen Reactions   Septra [Sulfamethoxazole-Trimethoprim] Hives    Patient Measurements: Height: 5\' 9"  (175.3 cm) Weight: 76.8 kg (169 lb 5 oz) IBW/kg (Calculated) : 66.2 Heparin Dosing Weight: ***  Vital Signs: Temp: 98.4 F (36.9 C) (05/19 1203) Temp Source: Core (05/19 0800) BP: 102/70 (05/19 1100) Pulse Rate: 102 (05/19 1203)  Labs: Recent Labs    06/20/21 0920 06/20/21 06/22/21 06/20/21 2325 06/20/21 2342 06/21/21 0635 06/21/21 0702 06/21/21 1010  HGB 16.2*   < >  --    < > 14.9 15.0 15.1*  HCT 52.1*   < >  --    < > 45.6 44.0 45.2  PLT 247  --   --   --  223  --  258  HEPARINUNFRC  --   --   --   --  <0.10*  --   --   CREATININE 0.89  --   --   --  0.95  --   --   TROPONINIHS 760*  --  585*  --   --   --   --    < > = values in this interval not displayed.    Estimated Creatinine Clearance: 69.9 mL/min (by C-G formula based on SCr of 0.95 mg/dL).   Medications:  {Meds:3041526:p}  Assessment: *** Goal of Therapy:  {Goals:3041534} {Monitor platelets by anticoagulation protocol:3041561::"Monitor platelets by anticoagulation protocol: Yes"}   Plan:  06/23/21  Karen Perry 06/21/2021,12:29 PM

## 2021-06-21 NOTE — TOC Benefit Eligibility Note (Signed)
Patient Scientific laboratory technician completed.     The patient is currently admitted and upon discharge could be taking FARXIGA 10 MG.   The current 30 day co-pay is, $95.   The patient is currently admitted and upon discharge could be taking JARDIANCE 10 MG.   The current 30 day co-pay is, $45.   The patient is currently admitted and upon discharge could be taking ENTRESTO 24-26 MG.   The current 30 day co-pay is, $45.   The patient is insured through [facility] [employer].

## 2021-06-21 NOTE — Progress Notes (Signed)
RT attempted A-Line X 2.  E-link aware.

## 2021-06-22 ENCOUNTER — Inpatient Hospital Stay (HOSPITAL_COMMUNITY): Payer: Medicare HMO

## 2021-06-22 ENCOUNTER — Other Ambulatory Visit (HOSPITAL_COMMUNITY): Payer: Medicare HMO

## 2021-06-22 DIAGNOSIS — I5041 Acute combined systolic (congestive) and diastolic (congestive) heart failure: Secondary | ICD-10-CM | POA: Diagnosis not present

## 2021-06-22 DIAGNOSIS — J9601 Acute respiratory failure with hypoxia: Secondary | ICD-10-CM | POA: Diagnosis not present

## 2021-06-22 DIAGNOSIS — I428 Other cardiomyopathies: Secondary | ICD-10-CM | POA: Diagnosis not present

## 2021-06-22 LAB — COMPREHENSIVE METABOLIC PANEL
ALT: 38 U/L (ref 0–44)
AST: 20 U/L (ref 15–41)
Albumin: 3.2 g/dL — ABNORMAL LOW (ref 3.5–5.0)
Alkaline Phosphatase: 69 U/L (ref 38–126)
Anion gap: 12 (ref 5–15)
BUN: 25 mg/dL — ABNORMAL HIGH (ref 6–20)
CO2: 28 mmol/L (ref 22–32)
Calcium: 9.4 mg/dL (ref 8.9–10.3)
Chloride: 102 mmol/L (ref 98–111)
Creatinine, Ser: 0.79 mg/dL (ref 0.44–1.00)
GFR, Estimated: >60 mL/min (ref >60)
Glucose, Bld: 190 mg/dL — ABNORMAL HIGH (ref 70–99)
Potassium: 3.6 mmol/L (ref 3.5–5.1)
Sodium: 142 mmol/L (ref 135–145)
Total Bilirubin: 1 mg/dL (ref 0.3–1.2)
Total Protein: 6.4 g/dL — ABNORMAL LOW (ref 6.5–8.1)

## 2021-06-22 LAB — HEPARIN LEVEL (UNFRACTIONATED)
Heparin Unfractionated: 1.04 IU/mL — ABNORMAL HIGH (ref 0.30–0.70)
Heparin Unfractionated: 0.9 IU/mL — ABNORMAL HIGH (ref 0.30–0.70)

## 2021-06-22 LAB — CBC
HCT: 44.9 % (ref 36.0–46.0)
Hemoglobin: 14.6 g/dL (ref 12.0–15.0)
MCH: 30.8 pg (ref 26.0–34.0)
MCHC: 32.5 g/dL (ref 30.0–36.0)
MCV: 94.7 fL (ref 80.0–100.0)
Platelets: 202 10*3/uL (ref 150–400)
RBC: 4.74 MIL/uL (ref 3.87–5.11)
RDW: 13.3 % (ref 11.5–15.5)
WBC: 15.9 10*3/uL — ABNORMAL HIGH (ref 4.0–10.5)
nRBC: 0 % (ref 0.0–0.2)

## 2021-06-22 LAB — ECHOCARDIOGRAM LIMITED
Area-P 1/2: 4.6 cm2
Calc EF: 34.9 %
Height: 69 in
S' Lateral: 3.3 cm
Single Plane A2C EF: 25.6 %
Single Plane A4C EF: 44.6 %
Weight: 2709.01 oz

## 2021-06-22 LAB — MAGNESIUM: Magnesium: 2 mg/dL (ref 1.7–2.4)

## 2021-06-22 LAB — LIPOPROTEIN A (LPA): Lipoprotein (a): 63.6 nmol/L — ABNORMAL HIGH (ref <75.0)

## 2021-06-22 LAB — COOXEMETRY PANEL
Carboxyhemoglobin: 2.6 % — ABNORMAL HIGH (ref 0.5–1.5)
Methemoglobin: <0.7 % (ref 0.0–1.5)
O2 Saturation: 85.2 %
Total hemoglobin: 13.9 g/dL (ref 12.0–16.0)

## 2021-06-22 LAB — POTASSIUM: Potassium: 3.3 mmol/L — ABNORMAL LOW (ref 3.5–5.1)

## 2021-06-22 LAB — PROTIME-INR
INR: 1 (ref 0.8–1.2)
Prothrombin Time: 13.5 seconds (ref 11.4–15.2)

## 2021-06-22 LAB — GLUCOSE, CAPILLARY
Glucose-Capillary: 175 mg/dL — ABNORMAL HIGH (ref 70–99)
Glucose-Capillary: 165 mg/dL — ABNORMAL HIGH (ref 70–99)
Glucose-Capillary: 153 mg/dL — ABNORMAL HIGH (ref 70–99)

## 2021-06-22 MED ORDER — FUROSEMIDE 10 MG/ML IJ SOLN
60.0000 mg | Freq: Two times a day (BID) | INTRAMUSCULAR | Status: DC
Start: 2021-06-22 — End: 2021-06-23
  Administered 2021-06-22 – 2021-06-23 (×3): 60 mg via INTRAVENOUS
  Filled 2021-06-22 (×3): qty 6

## 2021-06-22 MED ORDER — POTASSIUM CHLORIDE 20 MEQ PO PACK
40.0000 meq | PACK | Freq: Once | ORAL | Status: AC
  Administered 2021-06-22: 40 meq via ORAL
  Filled 2021-06-22: qty 2

## 2021-06-22 MED ORDER — POTASSIUM CHLORIDE CRYS ER 20 MEQ PO TBCR
20.0000 meq | EXTENDED_RELEASE_TABLET | ORAL | Status: AC
  Administered 2021-06-22 – 2021-06-23 (×2): 20 meq via ORAL
  Filled 2021-06-22 (×2): qty 1

## 2021-06-22 MED ORDER — LAMOTRIGINE 100 MG PO TABS
200.0000 mg | ORAL_TABLET | Freq: Every day | ORAL | Status: DC
  Filled 2021-06-22: qty 2

## 2021-06-22 MED ORDER — POTASSIUM CHLORIDE 20 MEQ PO PACK: 40.0000 meq | PACK | Freq: Two times a day (BID) | ORAL | Status: DC

## 2021-06-22 MED ORDER — HEPARIN (PORCINE) 25000 UT/250ML-% IV SOLN
1100.0000 [IU]/h | INTRAVENOUS | Status: DC
  Administered 2021-06-23: 1300 [IU]/h via INTRAVENOUS
  Administered 2021-06-24: 1100 [IU]/h via INTRAVENOUS
  Filled 2021-06-22 (×2): qty 250

## 2021-06-22 MED ORDER — SPIRONOLACTONE 12.5 MG HALF TABLET
12.5000 mg | ORAL_TABLET | Freq: Every day | ORAL | Status: DC
Start: 2021-06-22 — End: 2021-06-23
  Administered 2021-06-22 – 2021-06-23 (×2): 12.5 mg via ORAL
  Filled 2021-06-22 (×2): qty 1

## 2021-06-22 MED ORDER — POTASSIUM CHLORIDE 10 MEQ/100ML IV SOLN: 10.0000 meq | INTRAVENOUS | Status: DC

## 2021-06-22 MED ORDER — LAMOTRIGINE 25 MG PO TABS
25.0000 mg | ORAL_TABLET | Freq: Every day | ORAL | Status: DC
  Administered 2021-06-22 – 2021-06-23 (×2): 25 mg via ORAL
  Filled 2021-06-22 (×3): qty 1

## 2021-06-22 MED ORDER — PERFLUTREN LIPID MICROSPHERE
1.0000 mL | INTRAVENOUS | Status: AC | PRN
  Administered 2021-06-22: 3 mL via INTRAVENOUS

## 2021-06-22 MED ORDER — POTASSIUM CHLORIDE 10 MEQ/50ML IV SOLN
10.0000 meq | INTRAVENOUS | Status: AC
  Administered 2021-06-22 – 2021-06-23 (×4): 10 meq via INTRAVENOUS
  Filled 2021-06-22 (×4): qty 50

## 2021-06-22 NOTE — TOC CM/SW Note (Signed)
..   Transition of Care [facility]) Screening Note   Patient Details  Name: Karen Perry Date of Birth: Jul 18, 1965   Transition of Care [facility]) CM/SW Contact:    Erenest Rasher, RN Phone Number: [phone] 06/22/2021, 10:00 AM    Transition of Care Department [facility]) has reviewed patient. We will continue to monitor patient advancement through interdisciplinary progression rounds.  Will continue to follow for disposition. PT/OT recommendations pending.   [place], Heart Failure TOC CM [phone]

## 2021-06-22 NOTE — Consult Note (Signed)
   NAME:  Karen Perry, MRN:  [id number], DOB:  September 16, 1965, LOS: 2 ADMISSION DATE:  06/20/2021, CONSULTATION DATE:  06/20/21 REFERRING MD:  Bensimhon - Adv HF, CHIEF COMPLAINT:  SOB   History of Present Illness:   56 yo F PMH COPD, anxiety, bipolar disorder, HTN, seizures presented to ED 5/18 with SOB and chest tightness. She was placed on Bipap in ED with concern for pulm edema, given steroids, rocephin and azithro for possible AECOPD.   ECHO revealed EF 20-25%-- with concern for possible ischemic disease, taken urgently for cath. Received lasix in ED.   In cath lab, pt on [place]. Developed worsening SOB during case.  PCCM consulted in this setting   Pertinent  Medical History  COPD Bipolar Seizure HTN Anxiety  Significant Hospital Events: Including procedures, antibiotic start and stop dates in addition to other pertinent events   5/18 admit to [facility]. SOB -- concerning for acute systolic HF, pulm edema.  Taken to cath lab for concern for ischemia. Worse resp status, PCCM consult  Interim History / Subjective:  No overnight issues.  Minimal oral intake  Objective   Blood pressure 119/90, pulse (!) 107, temperature 98.4 F (36.9 C), resp. rate 16, height 5\' 9"  (1.753 m), weight 76.8 kg, last menstrual period 10/16/2014, SpO2 96 %. PAP: (32-62)/(21-36) 59/33 CVP:  [9 mmHg-14 mmHg] 12 mmHg CO:  [4.8 L/min-5.1 L/min] 5.1 L/min CI:  [2.8 L/min/m2-3 L/min/m2] 3 L/min/m2      Intake/Output Summary (Last 24 hours) at 06/22/2021 0948 Last data filed at 06/22/2021 0700 Gross per 24 hour  Intake 1871.58 ml  Output 1400 ml  Net 471.58 ml   Filed Weights   06/20/21 0955 06/20/21 1911 06/21/21 0500  Weight: 93.9 kg 76.5 kg 76.8 kg    Examination: Resting comfortably on nasal cannula, no distress Breath sounds diminshed, no wheezes PA catheter in place Tachycardic, regular Mild edema in lower extremities  Resolved Hospital Problem list     Assessment & Plan:   Acute  hypoxemic respiratory failure Severe COPD FEV1 24% of predicted with acute exacerbation P -bipap qhs and prn -nebs, steroids. Complete 5 days of steroids -diuresis increased today -complete azithro, rocephin x 5 days given elevated PCT and abnormal chest xray  Acute decompensated HFrEF exacerbation EF 20% Cardiogenic shock  P -Adv HF managing -- noted plan to titrate down pressors off today and continue diuresis -trend coox, lactic acid as needed - PA catheter in place, PAOP elevated  Anxiety Bipolar disorder  P - PRN anxiolysis  - add back home lamictal  Hypokalemia Best Practice (right click and "Reselect all SmartList Selections" daily)   Diet/type: regular diet DVT prophylaxis: systemic heparin GI prophylaxis: N/A Lines: Central line arterial line  Foley:  N/A Code Status:  full code Last date of multidisciplinary goals of care discussion [per primary]  06/23/21 Pulmonary and Critical Care Medicine 06/22/2021 9:48 AM  Pager: see AMION  If no response to pager , please call critical care on call (see AMION) until 7pm After 7:00 pm call Elink

## 2021-06-22 NOTE — Progress Notes (Signed)
Patient ID: Karen Perry, female   DOB: [birth year], 56 y.o.   MRN: [id number]    Advanced Heart Failure Rounding Note   Subjective:    Remains on milrinone 0.125 and NE 8 with stable BP.     No complaints.   Remains on ceftriaxone/azithromycin for possible PNA.   Swan numbers: CVP 12 PA 59/33 CI 3 Co-ox 85%  Objective:   Weight Range:  Vital Signs:   Temp:  [97.9 F (36.6 C)-99 F (37.2 C)] 98.4 F (36.9 C) (05/20 0811) Pulse Rate:  [81-120] 107 (05/20 0811) Resp:  [14-38] 16 (05/20 0811) BP: (100-146)/(69-103) 119/90 (05/20 0800) SpO2:  [91 %-99 %] 96 % (05/20 0811) Arterial Line BP: (103-129)/(68-86) 128/86 (05/20 0811) Last BM Date :  (PTA)  Weight change: Filed Weights   06/20/21 0955 06/20/21 1911 06/21/21 0500  Weight: 93.9 kg 76.5 kg 76.8 kg    Intake/Output:   Intake/Output Summary (Last 24 hours) at 06/22/2021 0845 Last data filed at 06/22/2021 0700 Gross per 24 hour  Intake 1918.69 ml  Output 1400 ml  Net 518.69 ml     Physical Exam: General: NAD Neck: JVP 10 cm, no thyromegaly or thyroid nodule.  Lungs: Decreased at bases. CV: Nondisplaced PMI.  Heart regular S1/S2, no S3/S4, no murmur.  No peripheral edema.   Abdomen: Soft, nontender, no hepatosplenomegaly, no distention.  Skin: Intact without lesions or rashes.  Neurologic: Alert and oriented x 3.  Psych: Normal affect. Extremities: No clubbing or cyanosis.  HEENT: Normal.   Telemetry: ST 100s. Personally reviewed   Labs: Basic Metabolic Panel: Recent Labs  Lab 06/20/21 0920 06/20/21 0928 06/20/21 1738 06/20/21 2342 06/21/21 0635 06/21/21 0702 06/21/21 1010 06/22/21 0618  NA 142   < > 142 140 139 139  --  142  K 4.2   < > 3.4* 3.3* 3.5 3.5  --  3.6  CL 106  --   --   --  102  --   --  102  CO2 27  --   --   --  27  --   --  28  GLUCOSE 156*  --   --   --  249*  --   --  190*  BUN 26*  --   --   --  29*  --   --  25*  CREATININE 0.89  --   --   --  0.95  --   --   0.79  CALCIUM 10.4*  --   --   --  9.7  --   --  9.4  MG  --   --   --   --   --   --  2.1  --    < > = values in this interval not displayed.    Liver Function Tests: Recent Labs  Lab 06/20/21 1500 06/22/21 0618  AST 41 20  ALT 49* 38  ALKPHOS 85 69  BILITOT 0.6 1.0  PROT 6.8 6.4*  ALBUMIN 3.4* 3.2*   No results for input(s): LIPASE, AMYLASE in the last 168 hours. No results for input(s): AMMONIA in the last 168 hours.  CBC: Recent Labs  Lab 06/20/21 0920 06/20/21 0928 06/20/21 2342 06/21/21 0635 06/21/21 0702 06/21/21 1010 06/22/21 0618  WBC 24.4*  --   --  19.0*  --  20.9* 15.9*  NEUTROABS 20.4*  --   --   --   --   --   --   HGB 16.2*   < >  15.3* 14.9 15.0 15.1* 14.6  HCT 52.1*   < > 45.0 45.6 44.0 45.2 44.9  MCV 97.7  --   --  94.0  --  92.6 94.7  PLT 247  --   --  223  --  258 202   < > = values in this interval not displayed.    Cardiac Enzymes: No results for input(s): CKTOTAL, CKMB, CKMBINDEX, TROPONINI in the last 168 hours.  BNP: BNP (last 3 results) Recent Labs    06/20/21 0920  BNP 2,324.8*    ProBNP (last 3 results) No results for input(s): PROBNP in the last 8760 hours.    Other results:  Imaging: CARDIAC CATHETERIZATION  Result Date: 06/20/2021 Findings: Ao = 112/82 (96) LV = 107/34 RA = 10 RV = 41/14 PA = 47/28 (37) PCW = 21 Fick cardiac output/index = 5.0/2.4 Thermo CO/CI = 4.6/2.2 PVR = 3.2 WU SVR = 1,440 FA sat = 95% PA sat = 70%, 71 Assessment: 1. Normal cors 2. Severe NICM EF 20% 3. Elevated filling pressures with normal cardiac output 4. LV apical thrombus on echo Plan/Discussion: Will leave swan in. Titrate NE and milrinone as needed. Diurese. Heparin for LV clot. CCM to co-manage. Arvilla Meres, MD 6:38 PM   DG CHEST PORT 1 VIEW  Result Date: 06/21/2021 CLINICAL DATA:  56 year old female with history of shortness of breath. EXAM: PORTABLE CHEST 1 VIEW COMPARISON:  Chest x-ray 06/20/2021. FINDINGS: Right IJ Cordis through  which a Swan-Ganz catheter has been passed into the distal aspect of the right main pulmonary artery. Lung volumes are low. Extensive bibasilar opacities may reflect areas of atelectasis and/or consolidation with superimposed small bilateral pleural effusions. No pneumothorax. Mild indistinctness of interstitial markings. Pulmonary vasculature remains mildly engorged, but less so than the recent prior study. Mild cardiomegaly. Upper mediastinal contours are within normal limits. IMPRESSION: 1. Findings suggest resolving congestive heart failure, as above. 2. Persistent bibasilar opacities which may reflect areas of atelectasis and/or consolidation, with persistent small bilateral pleural effusions. 3. Support apparatus, as above. Electronically Signed   By: Trudie Reed M.D.   On: 06/21/2021 07:00   Port CXR  Result Date: 06/20/2021 CLINICAL DATA:  Central line placement. EXAM: PORTABLE CHEST 1 VIEW COMPARISON:  Chest x-ray 06/20/2021 FINDINGS: There is a new Swan-Ganz catheter with distal tip projecting over the right main pulmonary artery. The cardiac silhouette is mildly enlarged, unchanged. There is central pulmonary vascular congestion. Bilateral lower lung airspace disease has not significantly changed. There is no pneumothorax or acute fracture. IMPRESSION: 1. New Swan-Ganz catheter tip projects over the right main pulmonary artery. 2. Stable bilateral lower lobe airspace disease. Electronically Signed   By: Darliss Cheney M.D.   On: 06/20/2021 20:15   DG Chest Port 1 View  Result Date: 06/20/2021 CLINICAL DATA:  sob EXAM: PORTABLE CHEST 1 VIEW COMPARISON:  Chest x-ray December 15, 2015. More recent priors are not available. FINDINGS: Diffuse interstitial and patchy bibasilar airspace opacities. No definite pleural effusions. No visible pneumothorax. Cardiomediastinal silhouette is within normal limits. No acute fracture. IMPRESSION: Diffuse interstitial and patchy bibasilar airspace opacities,  concerning for edema and/or multifocal pneumonia. Electronically Signed   By: Feliberto Harts M.D.   On: 06/20/2021 09:38   ECHOCARDIOGRAM COMPLETE  Result Date: 06/20/2021    ECHOCARDIOGRAM REPORT   Patient Name:   Karen Perry Date of Exam: 06/20/2021 Medical Rec #:  [id number]  Height:       69.0 in Accession #:    [id number]              Weight:       207.0 lb Date of Birth:  [birth year]                BSA:          2.096 m Patient Age:    56 years                BP:           104/83 mmHg Patient Gender: F                       HR:           120 bpm. Exam Location:  Inpatient Procedure: 2D Echo, Cardiac Doppler, Color Doppler and Intracardiac            Opacification Agent REPORT CONTAINS CRITICAL RESULT Indications:    R07.9* Chest pain, unspecified  History:        Patient has prior history of Echocardiogram examinations, most                 recent 02/22/2009. CHF, Abnormal ECG, Signs/Symptoms:Dyspnea and                 Shortness of Breath; Risk Factors:Hypertension and Dyslipidemia.  Sonographer:    Sheralyn Boatman RDCS Referring Phys: [id number] RONDELL A SMITH  Sonographer Comments: Technically difficult study due to poor echo windows. IMPRESSIONS  1. 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 Left ventricular ejection fraction, by estimation, is 20 to 25%. The left ventricle has severely decreased function. The  left ventricle has no regional wall motion abnormalities. There is mild left ventricular hypertrophy. Left ventricular diastolic parameters are indeterminate.  2. Right ventricular systolic function is normal. The right ventricular size is normal. There is normal pulmonary artery systolic pressure.  3. The mitral valve is normal in structure. No evidence of mitral valve regurgitation. No evidence of mitral stenosis.  4. The aortic valve is tricuspid. There is mild calcification of the aortic  valve. Aortic valve regurgitation is trivial. Aortic valve sclerosis is present, with no evidence of aortic valve stenosis.  5. The inferior vena cava is normal in size with greater than 50% respiratory variability, suggesting right atrial pressure of 3 mmHg. 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 Left ventricular ejection fraction, by estimation, is 20 to 25%. The left ventricle has severely decreased function. The left ventricle has no regional wall motion abnormalities. Definity contrast agent was given IV to delineate the left ventricular endocardial borders. The left ventricular internal cavity size was normal in size. There is mild left ventricular hypertrophy. Left ventricular diastolic parameters are indeterminate. Right Ventricle: The right ventricular size is normal. No increase in right ventricular wall thickness. Right ventricular systolic function is normal. There is normal pulmonary artery systolic pressure. The tricuspid regurgitant velocity is 2.56 m/s, and  with an assumed right atrial pressure of 8 mmHg, the estimated right ventricular systolic pressure is 34.2 mmHg. Left Atrium: Left atrial size was normal in size. Right Atrium: Right atrial size was normal in size. Pericardium: There is no evidence of pericardial effusion. Mitral Valve: The mitral valve is normal in structure.  No evidence of mitral valve regurgitation. No evidence of mitral valve stenosis. Tricuspid Valve: The tricuspid valve is normal in structure. Tricuspid valve regurgitation is mild . No evidence of tricuspid stenosis. Aortic Valve: The aortic valve is tricuspid. There is mild calcification of the aortic valve. Aortic valve regurgitation is trivial. Aortic valve sclerosis is present, with no evidence of aortic valve stenosis. Pulmonic Valve: The pulmonic valve was normal in structure. Pulmonic valve  regurgitation is mild. No evidence of pulmonic stenosis. Aorta: The aortic root is normal in size and structure. Venous: The inferior vena cava is normal in size with greater than 50% respiratory variability, suggesting right atrial pressure of 3 mmHg. IAS/Shunts: No atrial level shunt detected by color flow Doppler.  LEFT VENTRICLE PLAX 2D LVIDd:         4.30 cm      Diastology LVIDs:         3.50 cm      LV e' medial:    2.83 cm/s LV PW:         1.40 cm      LV E/e' medial:  24.1 LV IVS:        1.40 cm      LV e' lateral:   9.36 cm/s LVOT diam:     1.90 cm      LV E/e' lateral: 7.3 LV SV:         30 LV SV Index:   14 LVOT Area:     2.84 cm                              3D Volume EF: LV Volumes (MOD)            3D EF:        26 % LV vol d, MOD A2C: 150.0 ml LV EDV:       175 ml LV vol d, MOD A4C: 107.0 ml LV ESV:       130 ml LV vol s, MOD A2C: 118.0 ml LV SV:        45 ml LV vol s, MOD A4C: 94.0 ml LV SV MOD A2C:     32.0 ml LV SV MOD A4C:     107.0 ml LV SV MOD BP:      24.5 ml RIGHT VENTRICLE            IVC RV S prime:     6.96 cm/s  IVC diam: 1.20 cm TAPSE (M-mode): 1.3 cm LEFT ATRIUM             Index        RIGHT ATRIUM           Index LA diam:        2.50 cm 1.19 cm/m   RA Area:     12.30 cm LA Vol (A2C):   41.9 ml 19.99 ml/m  RA Volume:   33.30 ml  15.88 ml/m LA Vol (A4C):   46.2 ml 22.04 ml/m LA Biplane Vol: 44.9 ml 21.42 ml/m  AORTIC VALVE             PULMONIC VALVE LVOT Vmax:   80.30 cm/s  PR End Diast Vel: 1.75 msec LVOT Vmean:  60.900 cm/s LVOT VTI:    0.105 m  AORTA Ao Root diam: 2.80 cm Ao Asc diam:  3.10 cm MITRAL VALVE  TRICUSPID VALVE MV Area (PHT): 7.29 cm    TR Peak grad:   26.2 mmHg MV Decel Time: 104 msec    TR Vmax:        256.00 cm/s MV E velocity: 68.10 cm/s MV A velocity: 87.00 cm/s  SHUNTS MV E/A ratio:  0.78        Systemic VTI:  0.10 m                            Systemic Diam: 1.90 cm Charlton Haws MD Electronically signed by Charlton Haws MD Signature Date/Time:  06/20/2021/4:16:20 PM    Final      Medications:     Scheduled Medications:  arformoterol  15 mcg Nebulization BID   atorvastatin  10 mg Oral Daily   budesonide (PULMICORT) nebulizer solution  0.5 mg Nebulization BID   busPIRone  10 mg Oral BID   Chlorhexidine Gluconate Cloth  6 each Topical Daily   digoxin  0.125 mg Oral Daily   docusate sodium  100 mg Oral Daily   furosemide  40 mg Intravenous BID   mouth rinse  15 mL Mouth Rinse BID   methylPREDNISolone (SOLU-MEDROL) injection  40 mg Intravenous Q24H   polyethylene glycol  17 g Oral Daily   revefenacin  175 mcg Nebulization Daily   sodium chloride flush  3 mL Intravenous Q12H   sodium chloride flush  3 mL Intravenous Q12H    Infusions:  sodium chloride     sodium chloride     sodium chloride Stopped (06/20/21 2113)   sodium chloride 10 mL/hr at 06/22/21 0700   azithromycin Stopped (06/21/21 1106)   cefTRIAXone (ROCEPHIN)  IV     heparin 1,500 Units/hr (06/22/21 0700)   milrinone 0.125 mcg/kg/min (06/22/21 0700)   norepinephrine (LEVOPHED) Adult infusion 10 mcg/min (06/22/21 0700)    PRN Medications: sodium chloride, sodium chloride, acetaminophen, ipratropium-albuterol, LORazepam, ondansetron (ZOFRAN) IV, sodium chloride flush   Assessment/Plan:   1. Shock --> Cardiogenic +/--septic  -CXR possible PNA. WBC 24 => 16. On azithromycin + ceftriaxone.  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 BNP 2324 hs trop 585 -Cath 5/18 - Normal cors Filling pressures mildly elevated, CO 4.6, CI 2.2  - Hypotensive in the cath lab. Started on NE - Good cardiac output and co-ox 85% this morning with SBP 120s => Stop milrinone, wean NE (currently at 8).  - CVP 12, Lasix 60 mg IV bid today. Replace K.   2. Acute systolic HF due to NICM - cath 5/18 normal cors. 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  - swan numbers as above. Co-ox 85% - Continue diuresis as above.  - Stop milrinone and wean NE as  tolerated - Continue digoxin and spiro - eventual cMRI when respiratory status better  3 Acute Hypoxic Respiratory Failure multifactorial (HF/AECOPD) -Initially on Bipap --> [place]   -Given steroids, antibiotics, nebs.  - CCM following - severe underlying COPD (FEV1 24%)  4. Probable Apical Thrombus  -Continue heparin -Prior echo not definitive for apical thrombus, will order limited echo with contrast to define thrombus.    5. DMII  - SSI - Add SGLT2i soon  6. Bipolar Disorder/Anxiety  - takes prn Xanax at home  7. H/O Seizure   - follow  CRITICAL CARE Performed by: Marca Ancona  Total critical care time: 35 minutes  Critical care time was exclusive of separately billable procedures and treating  other patients.  Critical care was necessary to treat or prevent imminent or life-threatening deterioration.  Critical care was time spent personally by me (independent of midlevel providers or residents) on the following activities: development of treatment plan with patient and/or surrogate as well as nursing, discussions with consultants, evaluation of patient's response to treatment, examination of patient, obtaining history from patient or surrogate, ordering and performing treatments and interventions, ordering and review of laboratory studies, ordering and review of radiographic studies, pulse oximetry and re-evaluation of patient's condition.   Length of Stay: 2   Marca Ancona MD 06/22/2021, 8:45 AM  Advanced Heart Failure Team Pager [phone] (M-F; 7a - 4p)  Please contact [facility] for night-coverage after hours (4p -7a ) and weekends on [email or web address]

## 2021-06-22 NOTE — Progress Notes (Signed)
eLink Physician-Brief Progress Note Patient Name: Karen Perry DOB: [birth year] MRN: [id number]   Date of Service  06/22/2021  HPI/Events of Note  K+ 3.3  eICU Interventions  K+ replaced per E-Link electrolyte replacement protocol.        Migdalia Dk 06/22/2021, 8:33 PM

## 2021-06-22 NOTE — Progress Notes (Signed)
ANTICOAGULATION CONSULT NOTE - Follow Up Consult  Pharmacy Consult for Heparin infusion Indication: chest pain/ACS, LV apical thrombus  Allergies  Allergen Reactions   Septra [Sulfamethoxazole-Trimethoprim] Hives    Patient Measurements: Height: 5\' 9"  (175.3 cm) Weight: 76.8 kg (169 lb 5 oz) IBW/kg (Calculated) : 66.2 Heparin Dosing Weight: 86.1 kg  Vital Signs: Temp: 98.8 F (37.1 C) (05/20 1500) Temp Source: Core (05/20 1200) BP: 99/74 (05/20 1400) Pulse Rate: 108 (05/20 1500)  Labs: Recent Labs    06/20/21 0920 06/20/21 0928 06/20/21 2325 06/20/21 2342 06/21/21 0635 06/21/21 0702 06/21/21 1010 06/21/21 1808 06/22/21 0618 06/22/21 0903  HGB 16.2*   < >  --    < > 14.9 15.0 15.1*  --  14.6  --   HCT 52.1*   < >  --    < > 45.6 44.0 45.2  --  44.9  --   PLT 247  --   --   --  223  --  258  --  202  --   LABPROT  --   --   --   --   --   --   --   --  13.5  --   INR  --   --   --   --   --   --   --   --  1.0  --   HEPARINUNFRC  --   --   --    < > <0.10*  --   --  0.63 1.04* 0.90*  CREATININE 0.89  --   --   --  0.95  --   --   --  0.79  --   TROPONINIHS 760*  --  585*  --   --   --   --   --   --   --    < > = values in this interval not displayed.     Estimated Creatinine Clearance: 83 mL/min (by C-G formula based on SCr of 0.79 mg/dL).   Medical History: Past Medical History:  Diagnosis Date   Asthma    Bipolar 1 disorder (HCC)    Diabetes mellitus without complication (HCC)    Hypertension    Seizures (HCC)     Medications:  Medications Prior to Admission  Medication Sig Dispense Refill Last Dose   albuterol (PROVENTIL) (2.5 MG/3ML) 0.083% nebulizer solution USE 1 VIAL IN NEBULIZER EVERY 6 HOURS AS NEEDED FOR WHEEZING   06/19/2021   atorvastatin (LIPITOR) 10 MG tablet Take 10 mg by mouth daily.   06/19/2021   busPIRone (BUSPAR) 10 MG tablet Take 10 mg by mouth 2 (two) times daily.   06/19/2021   lamoTRIgine (LAMICTAL) 25 MG tablet Take 25 mg by  mouth daily.   Past Week   losartan (COZAAR) 25 MG tablet Take 25 mg by mouth daily.   06/19/2021   metoprolol tartrate (LOPRESSOR) 25 MG tablet Take 25 mg by mouth daily.   06/19/2021 at 2000   montelukast (SINGULAIR) 10 MG tablet Take 10 mg by mouth at bedtime.   06/19/2021   tiZANidine (ZANAFLEX) 2 MG tablet Take 2 mg by mouth every 8 (eight) hours as needed.   06/19/2021   TRELEGY ELLIPTA 100-62.5-25 MCG/ACT AEPB Take 1 puff by mouth daily.   06/19/2021    Assessment: 56 yo F presented to the ED with symptoms of SOB and found to have O2 Sat 85% on RA improved to 100% on 10L NRB. Pt placed on BiPAP.  BNP 2324.8 and troponin 760. Pt was not taking any anticoagulation prior to admission. Pharmacy consulted to dose heparin as per ACS protocol and for potential LV apical thrombus.   Heparin level increased to 0.9 > goal on heparin drip 1500 uts/hr  - confirmed wth recheck and heparin running in PIV and labs drawn from central line No overt bleeding or complications noted.  Goal of Therapy:  Heparin level 0.3-0.7 units/ml Monitor platelets by anticoagulation protocol: Yes   Plan:  Hold heparin for 1hr and decrease heparin drip 1300 units/hr. Daily CBC and heparin level. Monitor for s/sx of bleeding    Leota Sauers Pharm.D. CPP, BCPS Clinical Pharmacist [phone] 06/22/2021 4:06 PM     [facility] pharmacy phone numbers are listed on [email or web address]

## 2021-06-23 DIAGNOSIS — I5041 Acute combined systolic (congestive) and diastolic (congestive) heart failure: Secondary | ICD-10-CM | POA: Diagnosis not present

## 2021-06-23 DIAGNOSIS — J9601 Acute respiratory failure with hypoxia: Secondary | ICD-10-CM | POA: Diagnosis not present

## 2021-06-23 LAB — GLUCOSE, CAPILLARY
Glucose-Capillary: 125 mg/dL — ABNORMAL HIGH (ref 70–99)
Glucose-Capillary: 137 mg/dL — ABNORMAL HIGH (ref 70–99)
Glucose-Capillary: 153 mg/dL — ABNORMAL HIGH (ref 70–99)
Glucose-Capillary: 159 mg/dL — ABNORMAL HIGH (ref 70–99)
Glucose-Capillary: 161 mg/dL — ABNORMAL HIGH (ref 70–99)

## 2021-06-23 LAB — COMPREHENSIVE METABOLIC PANEL
ALT: 46 U/L — ABNORMAL HIGH (ref 0–44)
AST: 31 U/L (ref 15–41)
Albumin: 3.1 g/dL — ABNORMAL LOW (ref 3.5–5.0)
Alkaline Phosphatase: 68 U/L (ref 38–126)
Anion gap: 10 (ref 5–15)
BUN: 23 mg/dL — ABNORMAL HIGH (ref 6–20)
CO2: 32 mmol/L (ref 22–32)
Calcium: 9.4 mg/dL (ref 8.9–10.3)
Chloride: 101 mmol/L (ref 98–111)
Creatinine, Ser: 0.79 mg/dL (ref 0.44–1.00)
GFR, Estimated: >60 mL/min (ref >60)
Glucose, Bld: 157 mg/dL — ABNORMAL HIGH (ref 70–99)
Potassium: 4 mmol/L (ref 3.5–5.1)
Sodium: 143 mmol/L (ref 135–145)
Total Bilirubin: 1 mg/dL (ref 0.3–1.2)
Total Protein: 6.3 g/dL — ABNORMAL LOW (ref 6.5–8.1)

## 2021-06-23 LAB — CBC
HCT: 45.6 % (ref 36.0–46.0)
Hemoglobin: 14.4 g/dL (ref 12.0–15.0)
MCH: 30.1 pg (ref 26.0–34.0)
MCHC: 31.6 g/dL (ref 30.0–36.0)
MCV: 95.4 fL (ref 80.0–100.0)
Platelets: 168 10*3/uL (ref 150–400)
RBC: 4.78 MIL/uL (ref 3.87–5.11)
RDW: 13.4 % (ref 11.5–15.5)
WBC: 7.3 10*3/uL (ref 4.0–10.5)
nRBC: 0 % (ref 0.0–0.2)

## 2021-06-23 LAB — HEPARIN LEVEL (UNFRACTIONATED): Heparin Unfractionated: 0.7 IU/mL (ref 0.30–0.70)

## 2021-06-23 LAB — COOXEMETRY PANEL
Carboxyhemoglobin: 1.8 % — ABNORMAL HIGH (ref 0.5–1.5)
Methemoglobin: <0.7 % (ref 0.0–1.5)
O2 Saturation: 72.8 %
Total hemoglobin: 13.3 g/dL (ref 12.0–16.0)

## 2021-06-23 LAB — MAGNESIUM: Magnesium: 2.1 mg/dL (ref 1.7–2.4)

## 2021-06-23 MED ORDER — ATORVASTATIN CALCIUM 80 MG PO TABS
80.0000 mg | ORAL_TABLET | Freq: Every day | ORAL | Status: DC
  Administered 2021-06-24 – 2021-06-27 (×3): 80 mg via ORAL
  Filled 2021-06-23 (×3): qty 1

## 2021-06-23 MED ORDER — DAPAGLIFLOZIN PROPANEDIOL 10 MG PO TABS
10.0000 mg | ORAL_TABLET | Freq: Every day | ORAL | Status: DC
  Filled 2021-06-23: qty 1

## 2021-06-23 MED ORDER — FUROSEMIDE 10 MG/ML IJ SOLN
80.0000 mg | Freq: Two times a day (BID) | INTRAMUSCULAR | Status: AC
  Administered 2021-06-23 – 2021-06-24 (×3): 80 mg via INTRAVENOUS
  Filled 2021-06-23 (×3): qty 8

## 2021-06-23 MED ORDER — EMPAGLIFLOZIN 10 MG PO TABS
10.0000 mg | ORAL_TABLET | Freq: Every day | ORAL | Status: DC
  Administered 2021-06-23 – 2021-06-27 (×4): 10 mg via ORAL
  Filled 2021-06-23 (×5): qty 1

## 2021-06-23 MED ORDER — POTASSIUM CHLORIDE CRYS ER 20 MEQ PO TBCR
40.0000 meq | EXTENDED_RELEASE_TABLET | Freq: Two times a day (BID) | ORAL | Status: DC
  Administered 2021-06-23 (×2): 40 meq via ORAL
  Administered 2021-06-24: 20 meq via ORAL
  Administered 2021-06-25: 10 meq via ORAL
  Administered 2021-06-26 – 2021-06-27 (×3): 40 meq via ORAL
  Filled 2021-06-23 (×9): qty 2

## 2021-06-23 MED ORDER — FUROSEMIDE 10 MG/ML IJ SOLN
20.0000 mg | Freq: Once | INTRAMUSCULAR | Status: AC
  Administered 2021-06-23: 20 mg via INTRAVENOUS
  Filled 2021-06-23: qty 2

## 2021-06-23 MED ORDER — SPIRONOLACTONE 25 MG PO TABS
25.0000 mg | ORAL_TABLET | Freq: Every day | ORAL | Status: DC
Start: 2021-06-24 — End: 2021-06-26
  Administered 2021-06-24: 25 mg via ORAL
  Filled 2021-06-23: qty 1

## 2021-06-23 NOTE — Progress Notes (Signed)
   NAME:  Karen Perry, MRN:  [id number], DOB:  1965-05-06, LOS: 3 ADMISSION DATE:  06/20/2021, CONSULTATION DATE:  06/20/21 REFERRING MD:  Bensimhon - Adv HF, CHIEF COMPLAINT:  SOB   History of Present Illness:   56 yo F PMH COPD, anxiety, bipolar disorder, HTN, seizures presented to ED 5/18 with SOB and chest tightness. She was placed on Bipap in ED with concern for pulm edema, given steroids, rocephin and azithro for possible AECOPD.   ECHO revealed EF 20-25%-- with concern for possible ischemic disease, taken urgently for cath. Received lasix in ED.   In cath lab, pt on [employer]. Developed worsening SOB during case.  PCCM consulted in this setting   Pertinent  Medical History  COPD Bipolar Seizure HTN Anxiety  Significant Hospital Events: Including procedures, antibiotic start and stop dates in addition to other pertinent events   5/18 admit to [facility]. SOB -- concerning for acute systolic HF, pulm edema.  Taken to cath lab for concern for ischemia. Worse resp status, PCCM consult  Interim History / Subjective:  No overnight issues. Breathing stable. Family at bedside  Objective   Blood pressure 124/87, pulse (!) 106, temperature 98.6 F (37 C), resp. rate 19, height 5\' 9"  (1.753 m), weight 75.8 kg, last menstrual period 10/16/2014, SpO2 93 %. PAP: (32-61)/(15-36) 56/32 CVP:  [6 mmHg-21 mmHg] 12 mmHg CO:  [4.6 L/min-4.9 L/min] 4.9 L/min CI:  [2.7 L/min/m2-2.9 L/min/m2] 2.9 L/min/m2      Intake/Output Summary (Last 24 hours) at 06/23/2021 1051 Last data filed at 06/23/2021 0400 Gross per 24 hour  Intake 1672.47 ml  Output 2200 ml  Net -527.53 ml   Filed Weights   06/20/21 1911 06/21/21 0500 06/23/21 0500  Weight: 76.5 kg 76.8 kg 75.8 kg    Examination: Sitting up, breathing improved since yesterday No respiratory distress Breath sounds diminished, but no wheezes or crackles PA catheter in place Tachycardic, regular  Labs and imaging reviewed Coox is 72 Na 143 K  4.0 Cr 0.79 CBGs wnl  Resolved Hospital Problem list     Assessment & Plan:   Acute hypoxemic respiratory failure Severe COPD FEV1 24% of predicted with acute exacerbation P -bipap qhs and prn -nebs, steroids. Complete 5 days of steroids. Stop dates entered.  -diuresis per HF -complete azithro, rocephin x 5 days given elevated PCT and abnormal chest xray. Stop dates entered - COPD exacerbation component seems to be improving - will need to follow up in our office as outpatient eventually.   Acute decompensated HFrEF exacerbation EF 20% Cardiogenic shock  P -Adv HF managing -- noted plan to titrate down pressors off today and continue diuresis -trend coox, lactic acid as needed - PA catheter in place, PAOP elevated  Anxiety Bipolar disorder  P - PRN anxiolysis  - lamictal   Best Practice (right click and "Reselect all SmartList Selections" daily)   Diet/type: regular diet DVT prophylaxis: systemic heparin GI prophylaxis: N/A Lines: Central line arterial line  Foley:  N/A Code Status:  full code Last date of multidisciplinary goals of care discussion [per primary]  06/25/21, MD Pulmonary and Critical Care Medicine [facility] 06/23/2021 11:07 AM Pager: see AMION  If no response to pager, please call critical care on call (see AMION) until 7pm After 7:00 pm call Elink

## 2021-06-23 NOTE — Progress Notes (Signed)
Patient ID: Karen Perry, female   DOB: 03/15/1965, 56 y.o.   MRN: [id number]    Advanced Heart Failure Rounding Note   Subjective:    She is off milrinone and NE, co-ox 73% with CVP 13-14.  I/Os net negative -1017 yesterday with Lasix 60 mg IV bid.   No complaints.   Remains on ceftriaxone/azithromycin for possible PNA.   Limited echo with contrast (5/20): Some improvement in EF to 30%, no LV thrombus.   Swan numbers: CVP 13-14 PA 56/32 CI 3.8 Co-ox 73%  Objective:   Weight Range:  Vital Signs:   Temp:  [98.1 F (36.7 C)-99.5 F (37.5 C)] 98.6 F (37 C) (05/21 0745) Pulse Rate:  [93-116] 106 (05/21 0745) Resp:  [13-30] 19 (05/21 0745) BP: (89-127)/(58-87) 124/87 (05/21 0600) SpO2:  [91 %-96 %] 96 % (05/21 0745) Arterial Line BP: (92-131)/(63-90) 125/85 (05/21 0745) Weight:  [75.8 kg] 75.8 kg (05/21 0500) Last BM Date :  (PTA)  Weight change: Filed Weights   06/20/21 1911 06/21/21 0500 06/23/21 0500  Weight: 76.5 kg 76.8 kg 75.8 kg    Intake/Output:   Intake/Output Summary (Last 24 hours) at 06/23/2021 0830 Last data filed at 06/23/2021 0400 Gross per 24 hour  Intake 1845.1 ml  Output 2200 ml  Net -354.9 ml     Physical Exam: General: NAD Neck: JVP 10-12 cm, no thyromegaly or thyroid nodule.  Lungs: crackles at bases.  CV: Nondisplaced PMI.  Heart mildly tachy, regular S1/S2, no S3/S4, no murmur.  No peripheral edema.   Abdomen: Soft, nontender, no hepatosplenomegaly, no distention.  Skin: Intact without lesions or rashes.  Neurologic: Alert and oriented x 3.  Psych: Normal affect. Extremities: No clubbing or cyanosis.  HEENT: Normal.   Telemetry: ST 100s-110s. Personally reviewed   Labs: Basic Metabolic Panel: Recent Labs  Lab 06/20/21 0920 06/20/21 0928 06/20/21 2342 06/21/21 9166 06/21/21 0702 06/21/21 1010 06/22/21 0618 06/22/21 1737 06/23/21 0511  NA 142   < > 140 139 139  --  142  --  143  K 4.2   < > 3.3* 3.5 3.5  --   3.6 3.3* 4.0  CL 106  --   --  102  --   --  102  --  101  CO2 27  --   --  27  --   --  28  --  32  GLUCOSE 156*  --   --  249*  --   --  190*  --  157*  BUN 26*  --   --  29*  --   --  25*  --  23*  CREATININE 0.89  --   --  0.95  --   --  0.79  --  0.79  CALCIUM 10.4*  --   --  9.7  --   --  9.4  --  9.4  MG  --   --   --   --   --  2.1  --  2.0 2.1   < > = values in this interval not displayed.    Liver Function Tests: Recent Labs  Lab 06/20/21 1500 06/22/21 0618 06/23/21 0511  AST 41 20 31  ALT 49* 38 46*  ALKPHOS 85 69 68  BILITOT 0.6 1.0 1.0  PROT 6.8 6.4* 6.3*  ALBUMIN 3.4* 3.2* 3.1*   No results for input(s): LIPASE, AMYLASE in the last 168 hours. No results for input(s): AMMONIA in the last 168 hours.  CBC:  Recent Labs  Lab 06/20/21 0920 06/20/21 0928 06/21/21 0635 06/21/21 0702 06/21/21 1010 06/22/21 0618 06/23/21 0511  WBC 24.4*  --  19.0*  --  20.9* 15.9* 7.3  NEUTROABS 20.4*  --   --   --   --   --   --   HGB 16.2*   < > 14.9 15.0 15.1* 14.6 14.4  HCT 52.1*   < > 45.6 44.0 45.2 44.9 45.6  MCV 97.7  --  94.0  --  92.6 94.7 95.4  PLT 247  --  223  --  258 202 168   < > = values in this interval not displayed.    Cardiac Enzymes: No results for input(s): CKTOTAL, CKMB, CKMBINDEX, TROPONINI in the last 168 hours.  BNP: BNP (last 3 results) Recent Labs    06/20/21 0920  BNP 2,324.8*    ProBNP (last 3 results) No results for input(s): PROBNP in the last 8760 hours.    Other results:  Imaging: ECHOCARDIOGRAM LIMITED  Result Date: 06/22/2021    ECHOCARDIOGRAM LIMITED REPORT   Patient Name:   Karen Perry Date of Exam: 06/22/2021 Medical Rec #:  [id number]               Height:       69.0 in Accession #:    [id number]              Weight:       169.3 lb Date of Birth:  October 25, 1965                BSA:          1.925 m Patient Age:    56 years                BP:           102/80 mmHg Patient Gender: F                       HR:           107  bpm. Exam Location:  Inpatient Procedure: Limited Echo, Color Doppler, Cardiac Doppler and Intracardiac            Opacification Agent Indications:    I42.9 Cardiomyopathy (unspecified)  History:        Patient has prior history of Echocardiogram examinations.                 Cardiomyopathy and sepsis, cardiogenic shock; Risk                 Factors:Hypertension.  Sonographer:    Leta Jungling RDCS Referring Phys: [phone] Ashantia Amaral S Jasmon Graffam IMPRESSIONS  1. No LV thrombus is seen with Definity contrast administration. Left ventricular ejection fraction, by estimation, is 30%. The left ventricle has severely decreased function. The left ventricle demonstrates regional wall motion abnormalities (see scoring diagram/findings for description). Indeterminate diastolic filling due to E-A fusion.  2. Left atrial size was mildly dilated.  3. The mitral valve is normal in structure. Mild mitral valve regurgitation.  4. The aortic valve is normal in structure. Aortic valve regurgitation is trivial. Comparison(s): Prior images reviewed side by side. The left ventricular function has improved. FINDINGS  Left Ventricle: No LV thrombus is seen with Definity contrast administration. Left ventricular ejection fraction, by estimation, is 30%. The left ventricle has severely decreased function. The left ventricle demonstrates regional wall motion abnormalities. The left ventricular internal cavity size was normal in  size. There is no left ventricular hypertrophy. Indeterminate diastolic filling due to E-A fusion.  LV Wall Scoring: The mid and distal anterior wall, mid and distal anterior septum, and entire apex are akinetic. The mid anterolateral segment and mid inferior segment are hypokinetic. The posterior wall, basal anteroseptal segment, basal anterolateral segment, mid inferoseptal segment, basal anterior segment, basal inferior segment, and basal inferoseptal segment are normal. Left Atrium: Left atrial size was mildly dilated.  Pericardium: There is no evidence of pericardial effusion. Mitral Valve: The mitral valve is normal in structure. Mild mitral valve regurgitation, with centrally-directed jet. Tricuspid Valve: Tricuspid valve regurgitation is mild. Aortic Valve: The aortic valve is normal in structure. Aortic valve regurgitation is trivial. Additional Comments: A venous catheter is visualized in the right ventricle. LEFT VENTRICLE PLAX 2D LVIDd:         3.90 cm      Diastology LVIDs:         3.30 cm      LV e' medial:    5.77 cm/s LV PW:         0.80 cm      LV E/e' medial:  10.9 LV IVS:        0.80 cm      LV e' lateral:   6.31 cm/s                             LV E/e' lateral: 9.9  LV Volumes (MOD) LV vol d, MOD A2C: 156.0 ml LV vol d, MOD A4C: 140.0 ml LV vol s, MOD A2C: 116.0 ml LV vol s, MOD A4C: 77.5 ml LV SV MOD A2C:     40.0 ml LV SV MOD A4C:     140.0 ml LV SV MOD BP:      51.9 ml LEFT ATRIUM         Index LA diam:    4.10 cm 2.13 cm/m  AORTIC VALVE LVOT Vmax:   82.10 cm/s LVOT Vmean:  56.600 cm/s LVOT VTI:    0.122 m MITRAL VALVE               TRICUSPID VALVE MV Area (PHT): 4.60 cm    TR Peak grad:   22.7 mmHg MV Decel Time: 165 msec    TR Vmax:        238.00 cm/s MV E velocity: 62.70 cm/s MV A velocity: 61.30 cm/s  SHUNTS MV E/A ratio:  1.02        Systemic VTI: 0.12 m Mihai Croitoru MD Electronically signed by Thurmon Fair MD Signature Date/Time: 06/22/2021/4:57:09 PM    Final      Medications:     Scheduled Medications:  arformoterol  15 mcg Nebulization BID   atorvastatin  10 mg Oral Daily   budesonide (PULMICORT) nebulizer solution  0.5 mg Nebulization BID   busPIRone  10 mg Oral BID   Chlorhexidine Gluconate Cloth  6 each Topical Daily   digoxin  0.125 mg Oral Daily   docusate sodium  100 mg Oral Daily   furosemide  60 mg Intravenous BID   lamoTRIgine  25 mg Oral Daily   mouth rinse  15 mL Mouth Rinse BID   methylPREDNISolone (SOLU-MEDROL) injection  40 mg Intravenous Q24H   polyethylene glycol   17 g Oral Daily   revefenacin  175 mcg Nebulization Daily   sodium chloride flush  3 mL Intravenous Q12H   sodium chloride flush  3  mL Intravenous Q12H   spironolactone  12.5 mg Oral Daily    Infusions:  sodium chloride     sodium chloride     sodium chloride Stopped (06/20/21 2113)   sodium chloride Stopped (06/22/21 1251)   azithromycin Stopped (06/22/21 1031)   cefTRIAXone (ROCEPHIN)  IV 2 g (06/23/21 0805)   heparin 1,300 Units/hr (06/23/21 0701)   norepinephrine (LEVOPHED) Adult infusion Stopped (06/22/21 0904)    PRN Medications: sodium chloride, sodium chloride, acetaminophen, ipratropium-albuterol, LORazepam, ondansetron (ZOFRAN) IV, sodium chloride flush   Assessment/Plan:   1. Shock --> Cardiogenic +/--septic  -CXR possible PNA. WBC 24 => 16 => 7.3. On azithromycin + ceftriaxone.  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 BNP 2324 hs trop 585 -Cath 5/18 - Normal cors Filling pressures mildly elevated, CO 4.6, CI 2.2  - Hypotensive in the cath lab. Started on NE - Repeat limited echo 5/20: LV EF improved to 30%, no LV thrombus.  - Good cardiac output and co-ox 73% this morning with SBP stable, off milrinone and NE.  - CVP 13-14, Lasix 80 mg IV bid today. Replace K.  - Increase spironolactone to 25 mg daily.  - Add Farxiga 10 mg daily.  - Continue digoxin.   2. Acute systolic HF due to NICM - cath 5/18 normal cors. 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  - Plan as above.  - eventual cMRI when respiratory status better  3 Acute Hypoxic Respiratory Failure multifactorial (HF/AECOPD) -Initially on Bipap --> [place]   -Given steroids, antibiotics, nebs.  - CCM following - severe underlying COPD (FEV1 24%)  4. Probable Apical Thrombus  -Continue heparin -Prior echo not definitive for apical thrombus, will order limited echo with contrast to define thrombus => no thrombus seen on 5/20 echo with contrast.  Continue heparin for now, do not  think she needs long-term anticoagulation.    5. DMII  - SSI - Add SGLT2i   6. Bipolar Disorder/Anxiety  - takes prn Xanax at home  7. H/O Seizure   - follow  CRITICAL CARE Performed by: Marca Ancona  Total critical care time: 35 minutes  Critical care time was exclusive of separately billable procedures and treating other patients.  Critical care was necessary to treat or prevent imminent or life-threatening deterioration.  Critical care was time spent personally by me (independent of midlevel providers or residents) on the following activities: development of treatment plan with patient and/or surrogate as well as nursing, discussions with consultants, evaluation of patient's response to treatment, examination of patient, obtaining history from patient or surrogate, ordering and performing treatments and interventions, ordering and review of laboratory studies, ordering and review of radiographic studies, pulse oximetry and re-evaluation of patient's condition.   Length of Stay: 3   Marca Ancona MD 06/23/2021, 8:30 AM  Advanced Heart Failure Team Pager [phone] (M-F; 7a - 4p)  Please contact [facility] for night-coverage after hours (4p -7a ) and weekends on [email or web address]

## 2021-06-23 NOTE — Progress Notes (Signed)
ANTICOAGULATION CONSULT NOTE - Follow Up Consult  Pharmacy Consult for Heparin infusion Indication: chest pain/ACS, LV apical thrombus  Allergies  Allergen Reactions   Septra [Sulfamethoxazole-Trimethoprim] Hives    Patient Measurements: Height: 5\' 9"  (175.3 cm) Weight: 75.8 kg (167 lb 1.7 oz) IBW/kg (Calculated) : 66.2 Heparin Dosing Weight: 86.1 kg  Vital Signs: Temp: 98.6 F (37 C) (05/21 0745) BP: 124/87 (05/21 0600) Pulse Rate: 106 (05/21 0745)  Labs: Recent Labs    06/20/21 0920 06/20/21 [id number] 06/20/21 2325 06/20/21 2342 06/21/21 [id number] 06/21/21 0702 06/21/21 1010 06/21/21 1808 06/22/21 0618 06/22/21 0903 06/23/21 0511  HGB 16.2*   < >  --    < > 14.9   < > 15.1*  --  14.6  --  14.4  HCT 52.1*   < >  --    < > 45.6   < > 45.2  --  44.9  --  45.6  PLT 247  --   --   --  223  --  258  --  202  --  168  LABPROT  --   --   --   --   --   --   --   --  13.5  --   --   INR  --   --   --   --   --   --   --   --  1.0  --   --   HEPARINUNFRC  --   --   --   --  <0.10*  --   --    < > 1.04* 0.90* 0.70  CREATININE 0.89  --   --   --  0.95  --   --   --  0.79  --  0.79  TROPONINIHS 760*  --  585*  --   --   --   --   --   --   --   --    < > = values in this interval not displayed.     Estimated Creatinine Clearance: 83 mL/min (by C-G formula based on SCr of 0.79 mg/dL).   Medical History: Past Medical History:  Diagnosis Date   Asthma    Bipolar 1 disorder ([place])    Diabetes mellitus without complication ([place])    Hypertension    Seizures ([place])     Medications:  Medications Prior to Admission  Medication Sig Dispense Refill Last Dose   albuterol (PROVENTIL) (2.5 MG/3ML) 0.083% nebulizer solution USE 1 VIAL IN NEBULIZER EVERY 6 HOURS AS NEEDED FOR WHEEZING   06/19/2021   atorvastatin (LIPITOR) 10 MG tablet Take 10 mg by mouth daily.   06/19/2021   busPIRone (BUSPAR) 10 MG tablet Take 10 mg by mouth 2 (two) times daily.   06/19/2021   lamoTRIgine (LAMICTAL) 25 MG  tablet Take 25 mg by mouth daily.   Past Week   losartan (COZAAR) 25 MG tablet Take 25 mg by mouth daily.   06/19/2021   metoprolol tartrate (LOPRESSOR) 25 MG tablet Take 25 mg by mouth daily.   06/19/2021 at 2000   montelukast (SINGULAIR) 10 MG tablet Take 10 mg by mouth at bedtime.   06/19/2021   tiZANidine (ZANAFLEX) 2 MG tablet Take 2 mg by mouth every 8 (eight) hours as needed.   06/19/2021   TRELEGY ELLIPTA 100-62.5-25 MCG/ACT AEPB Take 1 puff by mouth daily.   06/19/2021    Assessment: 56 yo F presented to the ED with symptoms of SOB  and found to have O2 Sat 85% on RA improved to 100% on 10L NRB. Pt placed on BiPAP. BNP 2324.8 and troponin 760. Pt was not taking any anticoagulation prior to admission. Pharmacy consulted to dose heparin as per ACS protocol and for potential LV apical thrombus.   Heparin level 0.7 at op of goal on heparin drip 1300 uts/hr  - heparin running in PIV and labs drawn from central line No overt bleeding or complications noted.  Goal of Therapy:  Heparin level 0.3-0.7 units/ml Monitor platelets by anticoagulation protocol: Yes   Plan:  Decrease heparin drip 1100 units/hr. Daily CBC and heparin level. Monitor for s/sx of bleeding    Bonnita Nasuti Pharm.D. CPP, BCPS Clinical Pharmacist [phone] 06/23/2021 9:10 AM     [facility] pharmacy phone numbers are listed on [email or web address]

## 2021-06-23 NOTE — Progress Notes (Signed)
eLink Physician-Brief Progress Note Patient Name: Karen Perry DOB: 10/25/1965 MRN: [id number]   Date of Service  06/23/2021  HPI/Events of Note  Patient refused to take one of her two scheduled KCL pills because she does not like the taste.  eICU Interventions  I explained the implications of hypokalemia for her given her cardiomyopathy, including the risk of life threatening arrhythmias, and she agreed to take the pill.        Migdalia Dk 06/23/2021, 10:37 PM

## 2021-06-24 ENCOUNTER — Other Ambulatory Visit (HOSPITAL_COMMUNITY): Payer: Self-pay

## 2021-06-24 DIAGNOSIS — J9601 Acute respiratory failure with hypoxia: Secondary | ICD-10-CM | POA: Diagnosis not present

## 2021-06-24 LAB — GLUCOSE, CAPILLARY
Glucose-Capillary: 134 mg/dL — ABNORMAL HIGH (ref 70–99)
Glucose-Capillary: 122 mg/dL — ABNORMAL HIGH (ref 70–99)
Glucose-Capillary: 168 mg/dL — ABNORMAL HIGH (ref 70–99)
Glucose-Capillary: 170 mg/dL — ABNORMAL HIGH (ref 70–99)

## 2021-06-24 LAB — POCT I-STAT 7, (LYTES, BLD GAS, ICA,H+H)
Acid-Base Excess: 9 mmol/L — ABNORMAL HIGH (ref 0.0–2.0)
Bicarbonate: 32.4 mmol/L — ABNORMAL HIGH (ref 20.0–28.0)
Calcium, Ion: 1.25 mmol/L (ref 1.15–1.40)
HCT: 52 % — ABNORMAL HIGH (ref 36.0–46.0)
Hemoglobin: 17.7 g/dL — ABNORMAL HIGH (ref 12.0–15.0)
O2 Saturation: 96 %
Patient temperature: 97.7
Potassium: 3.8 mmol/L (ref 3.5–5.1)
Sodium: 138 mmol/L (ref 135–145)
TCO2: 34 mmol/L — ABNORMAL HIGH (ref 22–32)
pCO2 arterial: 38.2 mmHg (ref 32–48)
pH, Arterial: 7.535 — ABNORMAL HIGH (ref 7.35–7.45)
pO2, Arterial: 69 mmHg — ABNORMAL LOW (ref 83–108)

## 2021-06-24 LAB — COOXEMETRY PANEL
Carboxyhemoglobin: 2.1 % — ABNORMAL HIGH (ref 0.5–1.5)
Carboxyhemoglobin: 1.9 % — ABNORMAL HIGH (ref 0.5–1.5)
Methemoglobin: <0.7 % (ref 0.0–1.5)
Methemoglobin: 0.9 % (ref 0.0–1.5)
O2 Saturation: 72.8 %
O2 Saturation: 88.9 %
Total hemoglobin: 16.5 g/dL — ABNORMAL HIGH (ref 12.0–16.0)
Total hemoglobin: 17.1 g/dL — ABNORMAL HIGH (ref 12.0–16.0)

## 2021-06-24 LAB — CBC
HCT: 49.3 % — ABNORMAL HIGH (ref 36.0–46.0)
Hemoglobin: 16.2 g/dL — ABNORMAL HIGH (ref 12.0–15.0)
MCH: 30.7 pg (ref 26.0–34.0)
MCHC: 32.9 g/dL (ref 30.0–36.0)
MCV: 93.4 fL (ref 80.0–100.0)
Platelets: 176 10*3/uL (ref 150–400)
RBC: 5.28 MIL/uL — ABNORMAL HIGH (ref 3.87–5.11)
RDW: 13.3 % (ref 11.5–15.5)
WBC: 7 10*3/uL (ref 4.0–10.5)
nRBC: 0 % (ref 0.0–0.2)

## 2021-06-24 LAB — COMPREHENSIVE METABOLIC PANEL
ALT: 44 U/L (ref 0–44)
AST: 36 U/L (ref 15–41)
Albumin: 3.4 g/dL — ABNORMAL LOW (ref 3.5–5.0)
Alkaline Phosphatase: 69 U/L (ref 38–126)
Anion gap: 13 (ref 5–15)
BUN: 22 mg/dL — ABNORMAL HIGH (ref 6–20)
CO2: 31 mmol/L (ref 22–32)
Calcium: 9.7 mg/dL (ref 8.9–10.3)
Chloride: 99 mmol/L (ref 98–111)
Creatinine, Ser: 0.92 mg/dL (ref 0.44–1.00)
GFR, Estimated: >60 mL/min (ref >60)
Glucose, Bld: 148 mg/dL — ABNORMAL HIGH (ref 70–99)
Potassium: 4.4 mmol/L (ref 3.5–5.1)
Sodium: 143 mmol/L (ref 135–145)
Total Bilirubin: 1.6 mg/dL — ABNORMAL HIGH (ref 0.3–1.2)
Total Protein: 7.1 g/dL (ref 6.5–8.1)

## 2021-06-24 LAB — MRSA NEXT GEN BY PCR, NASAL: MRSA by PCR Next Gen: NOT DETECTED

## 2021-06-24 LAB — POCT I-STAT EG7
Acid-Base Excess: 6 mmol/L — ABNORMAL HIGH (ref 0.0–2.0)
Bicarbonate: 28.2 mmol/L — ABNORMAL HIGH (ref 20.0–28.0)
Calcium, Ion: 1.25 mmol/L (ref 1.15–1.40)
HCT: 48 % — ABNORMAL HIGH (ref 36.0–46.0)
Hemoglobin: 16.3 g/dL — ABNORMAL HIGH (ref 12.0–15.0)
O2 Saturation: 72 %
Potassium: 3.3 mmol/L — ABNORMAL LOW (ref 3.5–5.1)
Sodium: 142 mmol/L (ref 135–145)
TCO2: 29 mmol/L (ref 22–32)
pCO2, Ven: 33.4 mmHg — ABNORMAL LOW (ref 44–60)
pH, Ven: 7.534 — ABNORMAL HIGH (ref 7.25–7.43)
pO2, Ven: 33 mmHg (ref 32–45)

## 2021-06-24 LAB — MAGNESIUM: Magnesium: 2.3 mg/dL (ref 1.7–2.4)

## 2021-06-24 LAB — HEPARIN LEVEL (UNFRACTIONATED): Heparin Unfractionated: 0.49 IU/mL (ref 0.30–0.70)

## 2021-06-24 MED ORDER — LOSARTAN POTASSIUM 25 MG PO TABS
12.5000 mg | ORAL_TABLET | Freq: Every day | ORAL | Status: DC
  Administered 2021-06-24: 12.5 mg via ORAL
  Filled 2021-06-24: qty 1

## 2021-06-24 MED ORDER — MONTELUKAST SODIUM 10 MG PO TABS
10.0000 mg | ORAL_TABLET | Freq: Every day | ORAL | Status: DC
  Administered 2021-06-25 – 2021-06-26 (×2): 10 mg via ORAL
  Filled 2021-06-24 (×3): qty 1

## 2021-06-24 MED ORDER — LAMOTRIGINE 25 MG PO TABS
25.0000 mg | ORAL_TABLET | Freq: Every day | ORAL | Status: DC
  Administered 2021-06-24 – 2021-06-27 (×3): 25 mg via ORAL
  Filled 2021-06-24 (×5): qty 1

## 2021-06-24 MED ORDER — DEXMEDETOMIDINE HCL IN NACL 400 MCG/100ML IV SOLN
INTRAVENOUS | Status: AC
  Filled 2021-06-24: qty 100

## 2021-06-24 MED ORDER — TIZANIDINE HCL 4 MG PO TABS: 2.0000 mg | ORAL_TABLET | Freq: Three times a day (TID) | ORAL | Status: DC | PRN

## 2021-06-24 MED ORDER — DEXMEDETOMIDINE HCL IN NACL 400 MCG/100ML IV SOLN
0.4000 ug/kg/h | INTRAVENOUS | Status: DC
  Administered 2021-06-24: 0.4 ug/kg/h via INTRAVENOUS

## 2021-06-24 MED ORDER — APIXABAN 5 MG PO TABS
5.0000 mg | ORAL_TABLET | Freq: Two times a day (BID) | ORAL | Status: DC
Start: 2021-06-24 — End: 2021-06-27
  Administered 2021-06-24 – 2021-06-27 (×5): 5 mg via ORAL
  Filled 2021-06-24 (×6): qty 1

## 2021-06-24 NOTE — Progress Notes (Signed)
   NAME:  Karen Perry, MRN:  [id number], DOB:  June 03, 1965, LOS: 4 ADMISSION DATE:  06/20/2021, CONSULTATION DATE:  06/20/21 REFERRING MD:  Bensimhon - Adv HF, CHIEF COMPLAINT:  SOB   History of Present Illness:   56 yo F PMH COPD, anxiety, bipolar disorder, HTN, seizures presented to ED 5/18 with SOB and chest tightness. She was placed on Bipap in ED with concern for pulm edema, given steroids, rocephin and azithro for possible AECOPD.   ECHO revealed EF 20-25%-- with concern for possible ischemic disease, taken urgently for cath. Received lasix in ED.   In cath lab, pt on [place]. Developed worsening SOB during case.  PCCM consulted in this setting   Pertinent  Medical History  COPD Bipolar Seizure HTN Anxiety  Significant Hospital Events: Including procedures, antibiotic start and stop dates in addition to other pertinent events   5/18 admit to [facility]. SOB -- concerning for acute systolic HF, pulm edema.  Taken to cath lab for concern for ischemia. Worse resp status, PCCM consult  Interim History / Subjective:  No events. Wants to go home. Continues to diurese well.  Objective   Blood pressure (!) 127/99, pulse (!) 118, temperature 98.8 F (37.1 C), resp. rate 20, height 5\' 9"  (1.753 m), weight 72.3 kg, last menstrual period 10/16/2014, SpO2 98 %. PAP: (26-70)/(13-43) 53/27 CVP:  [2 mmHg-21 mmHg] 5 mmHg CO:  [5 L/min] 5 L/min CI:  [2.9 L/min/m2] 2.9 L/min/m2      Intake/Output Summary (Last 24 hours) at 06/24/2021 0754 Last data filed at 06/24/2021 0600 Gross per 24 hour  Intake 1685.84 ml  Output 3500 ml  Net -1814.16 ml    Filed Weights   06/21/21 0500 06/23/21 0500 06/24/21 0600  Weight: 76.8 kg 75.8 kg 72.3 kg    Examination: Anxious chronically ill appearing Lungs diminished bases, not hearing much wheezing Heart sounds tachycardic, ext warm +muscle wasting Aox3  CMP benign CBC stable CVP 8, SvO2 72% O  Resolved Hospital Problem list      Assessment & Plan:   Acute hypoxemic respiratory failure Severe COPD FEV1 24% of predicted, seems to have a resolved exacerbation, do not see prednisone ordered and breathlessness improving with diuresis Acute decompensated HFrEF exacerbation EF 20% Cardiogenic shock resolved Anxiety Bipolar disorder  DM2 with some hyperglycemia exacerbated by steroids  Nebs as ordered; DC abx Hopefully SGC out today Diuresis per primary Needs to mobilize, this will help her anxiety Encourage IS Will follow  06/26/21 MD PCCM

## 2021-06-24 NOTE — TOC Initial Note (Addendum)
Transition of Care [facility]) - Initial/Assessment Note    Patient Details  Name: Karen Perry MRN: [id number] Date of Birth: [birth year]  Transition of Care [facility]) CM/SW Contact:    Karen Cousin, RN Phone Number: [phone] 06/24/2021, 4:18 PM  Clinical Narrative:                 HF TOC CM spoke to pt and mother, Karen Perry at bedside. Pt states she has nebulizer machine at home. Mother states she will speak to husband to pick a scale. Pt may need oxygen. Will have Unit RN check ambulating oxygen saturation. Offered choice for Home Health. Pt states she does not feel she will need HH. Waiting PT/OT evaluation and recommendations.   Expected Discharge Plan: Home w Home Health Services Barriers to Discharge: Continued Medical Work up   Patient Goals and CMS Choice Patient states their goals for this hospitalization and ongoing recovery are:: wants to go home CMS Medicare.gov Compare Post Acute Care list provided to:: Patient Choice offered to / list presented to : Patient  Expected Discharge Plan and Services Expected Discharge Plan: Home w Home Health Services   Discharge Planning Services: CM Consult Post Acute Care Choice: Home Health Living arrangements for the past 2 months: Single Family Home                                      Prior Living Arrangements/Services Living arrangements for the past 2 months: Single Family Home Lives with:: Spouse Patient language and need for interpreter reviewed:: Yes Do you feel safe going back to the place where you live?: Yes      Need for Family Participation in Patient Care: Yes (Comment) Care giver support system in place?: Yes (comment) Current home services: DME (nebulizer machine) Criminal Activity/Legal Involvement Pertinent to Current Situation/Hospitalization: No - Comment as needed  Activities of Daily Living      Permission Sought/Granted Permission sought to share information with : Case Manager, Family  Supports, PCP Permission granted to share information with : Yes, Verbal Permission Granted  Share Information with NAME: Karen Perry  Permission granted to share info w AGENCY: Home Health, DME  Permission granted to share info w Relationship: husband  Permission granted to share info w Contact Information: [phone]  Emotional Assessment Appearance:: Appears stated age Attitude/Demeanor/Rapport: Engaged Affect (typically observed): Accepting Orientation: : Oriented to Self, Oriented to Place, Oriented to  Time, Oriented to Situation   Psych Involvement: No (comment)  Admission diagnosis:  COPD exacerbation (HCC) [J44.1] Acute respiratory failure with hypoxia (HCC) [J96.01] Acute on chronic respiratory failure with hypoxia (HCC) [J96.21] Acute congestive heart failure, unspecified heart failure type [facility]) [I50.9] Patient Active Problem List   Diagnosis Date Noted   Acute respiratory failure with hypoxia (HCC) 06/20/2021   CHF exacerbation (HCC) 06/20/2021   Elevated troponin 06/20/2021   SIRS (systemic inflammatory response syndrome) (HCC) 06/20/2021   Essential hypertension 06/20/2021   Moderate persistent asthma 06/20/2021   Hyperlipidemia 06/20/2021   Anxiety 06/20/2021   Aspiration pneumonia (HCC) 06/20/2021   Acute combined systolic and diastolic heart failure (HCC)    PCP:  Lynnea Ferrier, MD Pharmacy:   [facility] [address] [facility], [place] - [address] [facility] [place] [address] Phone: [phone] Fax: [phone]  [facility] [place] - [address] [place] [address] Phone: [phone] Fax: [phone]     Social Determinants of Health (SDOH) Interventions    Readmission Risk Interventions     View : No data to display.

## 2021-06-24 NOTE — TOC Benefit Eligibility Note (Signed)
Patient Advocate Encounter  Insurance verification completed.    The patient is currently admitted and upon discharge could be taking Eliquis 5 mg.  The current 30 day co-pay is, $45.00.   The patient is insured through [employer] Medicare Part D     Laurina Fischl, CPhT Pharmacy Patient Advocate Specialist [facility] Patient Advocate Team Direct Number: [phone]  Fax: [phone]        

## 2021-06-24 NOTE — Progress Notes (Addendum)
Patient ID: Karen Perry, female   DOB: January 25, 1966, 56 y.o.   MRN: [id number]    Advanced Heart Failure Rounding Note   Subjective:    Off Milrinone and NE. Co-ox stable at 73%. Tolerating GDMT initiation w/ stable BP and renal fx, SCr 0.92. K 4.4.  3.5 L in UOP yesterday. Wt down 8 lb. CVP 10   Remains on ceftriaxone/azithromycin for possible PNA. AF. WBC normal. BCx NGTD  Limited echo with contrast (5/20): Some improvement in EF to 30%, no LV thrombus.   Swan numbers: CVP 8 PA 37/27 (27) CI 2.44 Co-ox 73%  Feeling better. No current dyspnea. Remains tachycardic, sinus tach.   Objective:   Weight Range:  Vital Signs:   Temp:  [98.1 F (36.7 C)-99.5 F (37.5 C)] 98.6 F (37 C) (05/22 0819) Pulse Rate:  [96-123] 107 (05/22 0819) Resp:  [13-28] 24 (05/22 0819) BP: (95-138)/(68-106) 129/95 (05/22 0819) SpO2:  [89 %-99 %] 95 % (05/22 0819) Arterial Line BP: (92-145)/(73-134) 92/84 (05/21 2000) Weight:  [72.3 kg] 72.3 kg (05/22 0600) Last BM Date : 06/24/21  Weight change: Filed Weights   06/21/21 0500 06/23/21 0500 06/24/21 0600  Weight: 76.8 kg 75.8 kg 72.3 kg    Intake/Output:   Intake/Output Summary (Last 24 hours) at 06/24/2021 0828 Last data filed at 06/24/2021 0600 Gross per 24 hour  Intake 1633.88 ml  Output 2900 ml  Net -1266.12 ml     PHYSICAL EXAM: CVP 10  General:  Well appearing. No respiratory difficulty HEENT: normal + rt IJ swan, JVD 9 cm. Carotids 2+ bilat; no bruits. No lymphadenopathy or thyromegaly appreciated. Cor: PMI nondisplaced. Regular rhythm, tachy rate. No rubs, gallops or murmurs. Lungs: clear Abdomen: soft, nontender, nondistended. No hepatosplenomegaly. No bruits or masses. Good bowel sounds. Extremities: no cyanosis, clubbing, rash, edema Neuro: alert & oriented x 3, cranial nerves grossly intact. moves all 4 extremities w/o difficulty. Affect pleasant.  Telemetry: ST 100s-110s. Personally reviewed   Labs: Basic  Metabolic Panel: Recent Labs  Lab 06/20/21 0920 06/20/21 [id number] 06/21/21 [id number] 06/21/21 0702 06/21/21 1010 06/22/21 0618 06/22/21 1737 06/23/21 0511 06/24/21 0520  NA 142   < > 139 139  --  142  --  143 143  K 4.2   < > 3.5 3.5  --  3.6 3.3* 4.0 4.4  CL 106  --  102  --   --  102  --  101 99  CO2 27  --  27  --   --  28  --  32 31  GLUCOSE 156*  --  249*  --   --  190*  --  157* 148*  BUN 26*  --  29*  --   --  25*  --  23* 22*  CREATININE 0.89  --  0.95  --   --  0.79  --  0.79 0.92  CALCIUM 10.4*  --  9.7  --   --  9.4  --  9.4 9.7  MG  --   --   --   --  2.1  --  2.0 2.1 2.3   < > = values in this interval not displayed.    Liver Function Tests: Recent Labs  Lab 06/20/21 1500 06/22/21 0618 06/23/21 0511 06/24/21 0520  AST 41 20 31 36  ALT 49* 38 46* 44  ALKPHOS 85 69 68 69  BILITOT 0.6 1.0 1.0 1.6*  PROT 6.8 6.4* 6.3* 7.1  ALBUMIN 3.4* 3.2* 3.1* 3.4*  No results for input(s): LIPASE, AMYLASE in the last 168 hours. No results for input(s): AMMONIA in the last 168 hours.  CBC: Recent Labs  Lab 06/20/21 0920 06/20/21 0928 06/21/21 2458 06/21/21 0702 06/21/21 1010 06/22/21 0618 06/23/21 0511 06/24/21 0520  WBC 24.4*  --  19.0*  --  20.9* 15.9* 7.3 7.0  NEUTROABS 20.4*  --   --   --   --   --   --   --   HGB 16.2*   < > 14.9 15.0 15.1* 14.6 14.4 16.2*  HCT 52.1*   < > 45.6 44.0 45.2 44.9 45.6 49.3*  MCV 97.7  --  94.0  --  92.6 94.7 95.4 93.4  PLT 247  --  223  --  258 202 168 176   < > = values in this interval not displayed.    Cardiac Enzymes: No results for input(s): CKTOTAL, CKMB, CKMBINDEX, TROPONINI in the last 168 hours.  BNP: BNP (last 3 results) Recent Labs    06/20/21 0920  BNP 2,324.8*    ProBNP (last 3 results) No results for input(s): PROBNP in the last 8760 hours.    Other results:  Imaging: ECHOCARDIOGRAM LIMITED  Result Date: 06/22/2021    ECHOCARDIOGRAM LIMITED REPORT   Patient Name:   Karen Perry Date of  Exam: 06/22/2021 Medical Rec #:  [id number]               Height:       69.0 in Accession #:    [id number]              Weight:       169.3 lb Date of Birth:  October 12, 1965                BSA:          1.925 m Patient Age:    56 years                BP:           102/80 mmHg Patient Gender: F                       HR:           107 bpm. Exam Location:  Inpatient Procedure: Limited Echo, Color Doppler, Cardiac Doppler and Intracardiac            Opacification Agent Indications:    I42.9 Cardiomyopathy (unspecified)  History:        Patient has prior history of Echocardiogram examinations.                 Cardiomyopathy and sepsis, cardiogenic shock; Risk                 Factors:Hypertension.  Sonographer:    Leta Jungling RDCS Referring Phys: [phone] DALTON S MCLEAN IMPRESSIONS  1. No LV thrombus is seen with Definity contrast administration. Left ventricular ejection fraction, by estimation, is 30%. The left ventricle has severely decreased function. The left ventricle demonstrates regional wall motion abnormalities (see scoring diagram/findings for description). Indeterminate diastolic filling due to E-A fusion.  2. Left atrial size was mildly dilated.  3. The mitral valve is normal in structure. Mild mitral valve regurgitation.  4. The aortic valve is normal in structure. Aortic valve regurgitation is trivial. Comparison(s): Prior images reviewed side by side. The left ventricular function has improved. FINDINGS  Left Ventricle: No LV thrombus is seen with Definity contrast  administration. Left ventricular ejection fraction, by estimation, is 30%. The left ventricle has severely decreased function. The left ventricle demonstrates regional wall motion abnormalities. The left ventricular internal cavity size was normal in size. There is no left ventricular hypertrophy. Indeterminate diastolic filling due to E-A fusion.  LV Wall Scoring: The mid and distal anterior wall, mid and distal anterior septum, and entire apex are  akinetic. The mid anterolateral segment and mid inferior segment are hypokinetic. The posterior wall, basal anteroseptal segment, basal anterolateral segment, mid inferoseptal segment, basal anterior segment, basal inferior segment, and basal inferoseptal segment are normal. Left Atrium: Left atrial size was mildly dilated. Pericardium: There is no evidence of pericardial effusion. Mitral Valve: The mitral valve is normal in structure. Mild mitral valve regurgitation, with centrally-directed jet. Tricuspid Valve: Tricuspid valve regurgitation is mild. Aortic Valve: The aortic valve is normal in structure. Aortic valve regurgitation is trivial. Additional Comments: A venous catheter is visualized in the right ventricle. LEFT VENTRICLE PLAX 2D LVIDd:         3.90 cm      Diastology LVIDs:         3.30 cm      LV e' medial:    5.77 cm/s LV PW:         0.80 cm      LV E/e' medial:  10.9 LV IVS:        0.80 cm      LV e' lateral:   6.31 cm/s                             LV E/e' lateral: 9.9  LV Volumes (MOD) LV vol d, MOD A2C: 156.0 ml LV vol d, MOD A4C: 140.0 ml LV vol s, MOD A2C: 116.0 ml LV vol s, MOD A4C: 77.5 ml LV SV MOD A2C:     40.0 ml LV SV MOD A4C:     140.0 ml LV SV MOD BP:      51.9 ml LEFT ATRIUM         Index LA diam:    4.10 cm 2.13 cm/m  AORTIC VALVE LVOT Vmax:   82.10 cm/s LVOT Vmean:  56.600 cm/s LVOT VTI:    0.122 m MITRAL VALVE               TRICUSPID VALVE MV Area (PHT): 4.60 cm    TR Peak grad:   22.7 mmHg MV Decel Time: 165 msec    TR Vmax:        238.00 cm/s MV E velocity: 62.70 cm/s MV A velocity: 61.30 cm/s  SHUNTS MV E/A ratio:  1.02        Systemic VTI: 0.12 m Mihai Croitoru MD Electronically signed by Sanda Klein MD Signature Date/Time: 06/22/2021/4:57:09 PM    Final      Medications:     Scheduled Medications:  arformoterol  15 mcg Nebulization BID   atorvastatin  80 mg Oral Daily   budesonide (PULMICORT) nebulizer solution  0.5 mg Nebulization BID   busPIRone  10 mg Oral BID    Chlorhexidine Gluconate Cloth  6 each Topical Daily   digoxin  0.125 mg Oral Daily   docusate sodium  100 mg Oral Daily   empagliflozin  10 mg Oral Daily   furosemide  80 mg Intravenous BID   lamoTRIgine  25 mg Oral Daily   mouth rinse  15 mL Mouth Rinse BID   montelukast  10 mg Oral QHS   polyethylene glycol  17 g Oral Daily   potassium chloride  40 mEq Oral BID   revefenacin  175 mcg Nebulization Daily   sodium chloride flush  3 mL Intravenous Q12H   sodium chloride flush  3 mL Intravenous Q12H   spironolactone  25 mg Oral Daily    Infusions:  sodium chloride     sodium chloride     sodium chloride Stopped (06/20/21 2113)   sodium chloride Stopped (06/22/21 1251)   heparin 1,100 Units/hr (06/24/21 0600)   norepinephrine (LEVOPHED) Adult infusion Stopped (06/22/21 0904)    PRN Medications: sodium chloride, sodium chloride, acetaminophen, ipratropium-albuterol, LORazepam, ondansetron (ZOFRAN) IV, sodium chloride flush, tiZANidine   Assessment/Plan:   1. Shock --> Cardiogenic +/--septic  -CXR possible PNA. WBC 24 => 16 => 7.3=>7.0. On azithromycin + ceftriaxone.  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 BNP 2324 hs trop 585 -Cath 5/18 - Normal cors Filling pressures mildly elevated, CO 4.6, CI 2.2  - Hypotensive in the cath lab. Started on NE - Repeat limited echo 5/20: LV EF improved to 30%, no LV thrombus.  - Now off milrinone and NE. Co-ox 73%  - CVP 10, Continue IV Lasix 80 mg IV bid today.   - Continue spironolactone 25 mg daily.  - Continue Farxiga 10 mg daily.  - Continue digoxin 0.125.  - Add Losartan 12.5 mg daily  - Pull swan. Leave transducer   2. Acute systolic HF due to NICM - cath 5/18 normal cors. 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  - Plan as above.  - eventual cMRI when respiratory status better  3 Acute Hypoxic Respiratory Failure multifactorial (HF/AECOPD) -Initially on Bipap --> [place]   -Given steroids,  antibiotics, nebs.  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 Probable Apical Thrombus  -Continue heparin -Prior echo not definitive for apical thrombus, will order limited echo with contrast to define thrombus => no thrombus seen on 5/20 echo with contrast.   - stop heparin, switch to Eliquis     5. DMII  - SSI - Continue Jardiance   6. Bipolar Disorder/Anxiety  - takes prn Xanax at home  7. H/O Seizure   - follow    Length of Stay: [place] PA-C  06/24/2021, 8:28 AM  Advanced Heart Failure Team Pager [phone] (M-F; 7a - 4p)  Please contact [facility] for night-coverage after hours (4p -7a ) and weekends on [email or web address]   Agree with above.   Now off inotropes. Luiz Blare still in. Still mildly volume overloaded. Outputs improved. Less dyspneic. No CP. Remains anxious. On IV heparin  General:  Sitting up in bed  No resp difficulty HEENT: normal Neck: supple. RIJ swan  Carotids 2+ bilat; no bruits. No lymphadenopathy or thryomegaly appreciated. Cor: PMI nondisplaced. Regular tachy No rubs, gallops or murmurs. Lungs: decreased throughout Abdomen: soft, nontender, nondistended. No hepatosplenomegaly. No bruits or masses. Good bowel sounds. Extremities: no cyanosis, clubbing, rash, edema Neuro: alert & orientedx3, cranial nerves grossly intact. moves all 4 extremities w/o difficulty. Affect pleasant but anxious  Hemodynamically improved. Now off inotropes. Still volume overloaded. Continue IV lasix. Start losartan. Will pull swan. Leave introducer. Switch heparin to Eliquis. Ambulate.   CRITICAL CARE Performed by: Glori Bickers  Total critical care time: 35 minutes  Critical care time was exclusive of separately billable procedures and treating other patients.  Critical care was necessary to treat or  prevent imminent or life-threatening deterioration.  Critical care was time spent personally by me (independent of midlevel providers or  residents) on the following activities: development of treatment plan with patient and/or surrogate as well as nursing, discussions with consultants, evaluation of patient's response to treatment, examination of patient, obtaining history from patient or surrogate, ordering and performing treatments and interventions, ordering and review of laboratory studies, ordering and review of radiographic studies, pulse oximetry and re-evaluation of patient's condition.  Glori Bickers, MD  9:07 AM

## 2021-06-24 NOTE — Progress Notes (Signed)
ANTICOAGULATION CONSULT NOTE - Follow Up Consult  Pharmacy Consult for Heparin infusion > apixaban Indication: chest pain/ACS, LV apical thrombus  Allergies  Allergen Reactions   Septra [Sulfamethoxazole-Trimethoprim] Hives    Patient Measurements: Height: 5\' 9"  (175.3 cm) Weight: 72.3 kg (159 lb 6.3 oz) IBW/kg (Calculated) : 66.2 Heparin Dosing Weight: 86.1 kg  Vital Signs: Temp: 98.6 F (37 C) (05/22 0819) BP: 129/95 (05/22 0819) Pulse Rate: 107 (05/22 0819)  Labs: Recent Labs    06/22/21 0618 06/22/21 0903 06/23/21 0511 06/24/21 0520  HGB 14.6  --  14.4 16.2*  HCT 44.9  --  45.6 49.3*  PLT 202  --  168 176  LABPROT 13.5  --   --   --   INR 1.0  --   --   --   HEPARINUNFRC 1.04* 0.90* 0.70 0.49  CREATININE 0.79  --  0.79 0.92     Estimated Creatinine Clearance: 72.2 mL/min (by C-G formula based on SCr of 0.92 mg/dL).   Medical History: Past Medical History:  Diagnosis Date   Asthma    Bipolar 1 disorder (HCC)    Diabetes mellitus without complication (HCC)    Hypertension    Seizures (HCC)     Medications:  Medications Prior to Admission  Medication Sig Dispense Refill Last Dose   albuterol (PROVENTIL) (2.5 MG/3ML) 0.083% nebulizer solution USE 1 VIAL IN NEBULIZER EVERY 6 HOURS AS NEEDED FOR WHEEZING   06/19/2021   atorvastatin (LIPITOR) 10 MG tablet Take 10 mg by mouth daily.   06/19/2021   busPIRone (BUSPAR) 10 MG tablet Take 10 mg by mouth 2 (two) times daily.   06/19/2021   lamoTRIgine (LAMICTAL) 25 MG tablet Take 25 mg by mouth daily.   Past Week   losartan (COZAAR) 25 MG tablet Take 25 mg by mouth daily.   06/19/2021   metoprolol tartrate (LOPRESSOR) 25 MG tablet Take 25 mg by mouth daily.   06/19/2021 at 2000   montelukast (SINGULAIR) 10 MG tablet Take 10 mg by mouth at bedtime.   06/19/2021   tiZANidine (ZANAFLEX) 2 MG tablet Take 2 mg by mouth every 8 (eight) hours as needed.   06/19/2021   TRELEGY ELLIPTA 100-62.5-25 MCG/ACT AEPB Take 1 puff by  mouth daily.   06/19/2021    Assessment: 56 yo F presented to the ED with symptoms of SOB and found to have O2 Sat 85% on RA improved to 100% on 10L NRB. Pt placed on BiPAP. BNP 2324.8 and troponin 760. Pt was not taking any anticoagulation prior to admission. Pharmacy consulted to dose heparin as per ACS protocol and for potential LV apical thrombus.   Heparin level 0.49 at goal on heparin drip 1100 uts/hr  - heparin running in PIV and labs drawn from central line No overt bleeding or complications noted. ECHO with possible LV thrombus> will continue anticoagulation for few months and recheck echo to re-evaluate ongoing need for anticoagulation. Cbc stable, no bleeding Cr < 1.5, age < 80, wt > 60kg  Goal of Therapy:  Heparin level 0.3-0.7 units/ml Monitor platelets by anticoagulation protocol: Yes   Plan:  Stop heparin drip > apixaban 5mg  BID Monitor for s/sx of bleeding    53 Pharm.D. CPP, BCPS Clinical Pharmacist [phone] 06/24/2021 9:20 AM     [facility] pharmacy phone numbers are listed on [email or web address]

## 2021-06-25 ENCOUNTER — Inpatient Hospital Stay (HOSPITAL_COMMUNITY): Payer: Medicare HMO

## 2021-06-25 DIAGNOSIS — J9601 Acute respiratory failure with hypoxia: Secondary | ICD-10-CM | POA: Diagnosis not present

## 2021-06-25 LAB — COMPREHENSIVE METABOLIC PANEL
ALT: 46 U/L — ABNORMAL HIGH (ref 0–44)
AST: 23 U/L (ref 15–41)
Albumin: 3.7 g/dL (ref 3.5–5.0)
Alkaline Phosphatase: 76 U/L (ref 38–126)
Anion gap: 15 (ref 5–15)
BUN: 35 mg/dL — ABNORMAL HIGH (ref 6–20)
CO2: 28 mmol/L (ref 22–32)
Calcium: 9.6 mg/dL (ref 8.9–10.3)
Chloride: 99 mmol/L (ref 98–111)
Creatinine, Ser: 1.18 mg/dL — ABNORMAL HIGH (ref 0.44–1.00)
GFR, Estimated: 55 mL/min — ABNORMAL LOW (ref >60)
Glucose, Bld: 139 mg/dL — ABNORMAL HIGH (ref 70–99)
Potassium: 3.8 mmol/L (ref 3.5–5.1)
Sodium: 142 mmol/L (ref 135–145)
Total Bilirubin: 1.4 mg/dL — ABNORMAL HIGH (ref 0.3–1.2)
Total Protein: 7.1 g/dL (ref 6.5–8.1)

## 2021-06-25 LAB — COOXEMETRY PANEL
Carboxyhemoglobin: 1.9 % — ABNORMAL HIGH (ref 0.5–1.5)
Carboxyhemoglobin: 2 % — ABNORMAL HIGH (ref 0.5–1.5)
Methemoglobin: 0.7 % (ref 0.0–1.5)
Methemoglobin: <0.7 % (ref 0.0–1.5)
O2 Saturation: 91.3 %
O2 Saturation: 81.5 %
Total hemoglobin: 17.9 g/dL — ABNORMAL HIGH (ref 12.0–16.0)
Total hemoglobin: 17.9 g/dL — ABNORMAL HIGH (ref 12.0–16.0)

## 2021-06-25 LAB — CBC
HCT: 53.5 % — ABNORMAL HIGH (ref 36.0–46.0)
Hemoglobin: 17.8 g/dL — ABNORMAL HIGH (ref 12.0–15.0)
MCH: 30.7 pg (ref 26.0–34.0)
MCHC: 33.3 g/dL (ref 30.0–36.0)
MCV: 92.2 fL (ref 80.0–100.0)
Platelets: 246 10*3/uL (ref 150–400)
RBC: 5.8 MIL/uL — ABNORMAL HIGH (ref 3.87–5.11)
RDW: 13.3 % (ref 11.5–15.5)
WBC: 15.7 10*3/uL — ABNORMAL HIGH (ref 4.0–10.5)
nRBC: 0 % (ref 0.0–0.2)

## 2021-06-25 LAB — URINALYSIS, ROUTINE W REFLEX MICROSCOPIC
Bilirubin Urine: NEGATIVE
Glucose, UA: >=500 mg/dL — AB
Ketones, ur: 80 mg/dL — AB
Nitrite: NEGATIVE
Protein, ur: NEGATIVE mg/dL
Specific Gravity, Urine: 1.027 (ref 1.005–1.030)
pH: 5 (ref 5.0–8.0)

## 2021-06-25 LAB — GLUCOSE, CAPILLARY
Glucose-Capillary: 117 mg/dL — ABNORMAL HIGH (ref 70–99)
Glucose-Capillary: 88 mg/dL (ref 70–99)
Glucose-Capillary: 110 mg/dL — ABNORMAL HIGH (ref 70–99)
Glucose-Capillary: 25 mg/dL — CL (ref 70–99)
Glucose-Capillary: 102 mg/dL — ABNORMAL HIGH (ref 70–99)

## 2021-06-25 LAB — MAGNESIUM: Magnesium: 2.5 mg/dL — ABNORMAL HIGH (ref 1.7–2.4)

## 2021-06-25 MED ORDER — LORAZEPAM 2 MG/ML IJ SOLN
0.5000 mg | INTRAMUSCULAR | Status: DC | PRN
  Administered 2021-06-25: 0.5 mg via INTRAVENOUS
  Filled 2021-06-25: qty 1

## 2021-06-25 MED ORDER — HALOPERIDOL LACTATE 5 MG/ML IJ SOLN
INTRAMUSCULAR | Status: AC
Start: 2021-06-25 — End: 2021-06-25
  Administered 2021-06-25: 2 mg via INTRAVENOUS
  Filled 2021-06-25: qty 1

## 2021-06-25 MED ORDER — QUETIAPINE FUMARATE 25 MG PO TABS
25.0000 mg | ORAL_TABLET | Freq: Two times a day (BID) | ORAL | Status: DC
  Administered 2021-06-25: 25 mg via ORAL
  Filled 2021-06-25: qty 1

## 2021-06-25 MED ORDER — SODIUM CHLORIDE 0.9 % IV BOLUS: 250.0000 mL | Freq: Once | INTRAVENOUS | Status: DC | PRN

## 2021-06-25 MED ORDER — HALOPERIDOL LACTATE 5 MG/ML IJ SOLN: 2.0000 mg | Freq: Four times a day (QID) | INTRAMUSCULAR | Status: DC | PRN

## 2021-06-25 NOTE — Progress Notes (Signed)
Pt on precedex from previous shift due to extreme agitation and paranoia. Pt uncooperative with all treatments.Husband at the bedside. At 2030 pt B/P 50-60/40's. HR 80's down from 120's. Pt very sleepy but responsive.Cardiology and elink MD contacted. Precedex titrated off nor epi restarted, and fluid bolus given per MD order. Vitals per flow sheet.

## 2021-06-25 NOTE — Progress Notes (Signed)
PT Cancellation Note  Patient Details Name: MEHA VIDRINE MRN: [id number] DOB: 12-20-1965   Cancelled Treatment:    Reason Eval/Treat Not Completed: Patient not medically ready Pt agitated. Nsg asking to hold at this time. Will check back later time.   Blake Divine A Virgina Deakins 06/25/2021, 12:56 PM [place], PT, DPT Acute Rehabilitation Services Secure chat preferred Office [phone]

## 2021-06-25 NOTE — Progress Notes (Signed)
Initial Nutrition Assessment  DOCUMENTATION CODES:    (Unable to assess due to limited ability to gain information)  INTERVENTION:   Liberalize diet to REGULAR  If pt continues to refuse food and drinks, will need to consider alternative means for nutrition-Cortrak (if able-pt with delerium currently) vs TPN if unable to obtain enteral access. Per limited report, pt has not had a "good meal" since 5/15. Discussed with PCCM   NUTRITION DIAGNOSIS:   Inadequate oral intake related to lethargy/confusion, acute illness as evidenced by per patient/family report, meal completion < 25%.   GOAL:   Patient will meet greater than or equal to 90% of their needs   MONITOR:   PO intake, Labs, Weight trends  REASON FOR ASSESSMENT:   Rounds    ASSESSMENT:   56 yo female admitted with acute respiratory failure with severe COPD, acute CHF, shock. PMH includes DM, HTN, seizures, bipolar disorder, +current daily tobacco use  Pt is confused, delirious. Currently on precedex. Pt does not participate in RD assessment. Pt not wanting to be touched, not wanting to speak. Mother at bedside   Pt refusing meds and food/drink at this time; pt not even drinking water, which per pt's mother, she drinks water throughout the day at home, pt always has a bottle of water in her hands. Mother reports pt did not eat anything yesterday either, took a few sips of water with meds  RN and mother report that pt is concerned re: choking and think that his may be impacting pt not wanting to eat. However, no overt signs/symptoms of this. In fact, mother reports pt indicated she felt like she was choking yesterday when she had not taking anything by mouth.   Mother indicates pt last full meal was Monday May 15th, >1 week ago  Current wt 71.6 kg, current wt 76.5 kg. Net negative 3.3 L. No recent weight encounters, last weight from 2016. Mother thinks pt has lost weight but not really sure, does not know pt's  UBW  Labs: reviewed Meds: miralax, KCL  NUTRITION - FOCUSED PHYSICAL EXAM:  Deferred; pt does not want to be touched at this time  Diet Order:  RENAL/CARB MODIFIED with 1200 mL fluid restriction   EDUCATION NEEDS:   Not appropriate for education at this time  Skin:  Skin Assessment: Reviewed RN Assessment  Last BM:  5/22  Height:   Ht Readings from Last 1 Encounters:  06/20/21 5\' 9"  (1.753 m)    Weight:   Wt Readings from Last 1 Encounters:  06/25/21 71.6 kg    BMI:  Body mass index is 23.31 kg/m.  Estimated Nutritional Needs:   Kcal:  1750-1980 kcals  Protein:  85-100 g  Fluid:  >/= 1.8 L   06/27/21 MS, RDN, LDN, CNSC Registered Dietitian III Clinical Nutrition RD Pager and On-Call Pager Number Located in [place]

## 2021-06-25 NOTE — Progress Notes (Addendum)
Patient ID: Karen Perry, female   DOB: [birth year], 56 y.o.   MRN: [id number]    Advanced Heart Failure Rounding Note   Subjective:    Delirious and agitated overnight. Combative, refused PM meds. Placed on precedex and developed hypotension. Started back on NE, initially requiring 20 mcg. RN now weaning, down to 11.   C/w confusions and agitation this morning. Not cooperative.   ? Infection. WBC up, 7.0>>15.7 today. Afebrile. Abx discontinued yesterday. Completed 4 day course for possible PNA. UA ordered to r/o UTI.   Remains off milrinone. Co-ox resulted at 91%, doubt accurate. Repeat pending   Scr 0.92>>1.18   Wt down another 2 lb. CVP 4 overnight. C/w sinus tach 120s. No dyspnea.   Limited echo with contrast (5/20): Some improvement in EF to 30%, no LV thrombus.    Objective:   Weight Range:  Vital Signs:   Temp:  [97.7 F (36.5 C)-99 F (37.2 C)] 97.7 F (36.5 C) (05/22 2000) Pulse Rate:  [70-139] 112 (05/23 0700) Resp:  [15-29] 21 (05/23 0700) BP: (57-140)/(38-106) 125/94 (05/23 0700) SpO2:  [90 %-98 %] 96 % (05/23 0700) Weight:  [71.6 kg] 71.6 kg (05/23 0600) Last BM Date : 06/24/21  Weight change: Filed Weights   06/23/21 0500 06/24/21 0600 06/25/21 0600  Weight: 75.8 kg 72.3 kg 71.6 kg    Intake/Output:   Intake/Output Summary (Last 24 hours) at 06/25/2021 0803 Last data filed at 06/25/2021 0600 Gross per 24 hour  Intake 934.83 ml  Output 2200 ml  Net -1265.17 ml    PHYSICAL EXAM: CVP 4  General:  confused. No respiratory difficulty HEENT: normal Neck: supple. JVD not elevated, +rt IJ CVC transducer in place Carotids 2+ bilat; no bruits. No lymphadenopathy or thyromegaly appreciated. Cor: PMI nondisplaced. Regular rhythm, tachy rate. No rubs, gallops or murmurs. Lungs: clear Abdomen: soft, nontender, nondistended. No hepatosplenomegaly. No bruits or masses. Good bowel sounds. Extremities: no cyanosis, clubbing, rash, edema + mittens on  hands  Neuro: confused, delirious    Telemetry:  Sinus tach 120s Personally reviewed   Labs: Basic Metabolic Panel: Recent Labs  Lab 06/21/21 0635 06/21/21 0702 06/21/21 1010 06/22/21 0618 06/22/21 1737 06/23/21 0511 06/24/21 0520 06/24/21 2110 06/25/21 0525  NA 139   < >  --  142  --  143 143 138 142  K 3.5   < >  --  3.6 3.3* 4.0 4.4 3.8 3.8  CL 102  --   --  102  --  101 99  --  99  CO2 27  --   --  28  --  32 31  --  28  GLUCOSE 249*  --   --  190*  --  157* 148*  --  139*  BUN 29*  --   --  25*  --  23* 22*  --  35*  CREATININE 0.95  --   --  0.79  --  0.79 0.92  --  1.18*  CALCIUM 9.7  --   --  9.4  --  9.4 9.7  --  9.6  MG  --   --  2.1  --  2.0 2.1 2.3  --  2.5*   < > = values in this interval not displayed.    Liver Function Tests: Recent Labs  Lab 06/20/21 1500 06/22/21 0618 06/23/21 0511 06/24/21 0520 06/25/21 0525  AST 41 20 31 36 23  ALT 49* 38 46* 44 46*  ALKPHOS 85 69 68  69 76  BILITOT 0.6 1.0 1.0 1.6* 1.4*  PROT 6.8 6.4* 6.3* 7.1 7.1  ALBUMIN 3.4* 3.2* 3.1* 3.4* 3.7   No results for input(s): LIPASE, AMYLASE in the last 168 hours. No results for input(s): AMMONIA in the last 168 hours.  CBC: Recent Labs  Lab 06/20/21 0920 06/20/21 0928 06/21/21 1010 06/22/21 0618 06/23/21 0511 06/24/21 0520 06/24/21 2110 06/25/21 0525  WBC 24.4*   < > 20.9* 15.9* 7.3 7.0  --  15.7*  NEUTROABS 20.4*  --   --   --   --   --   --   --   HGB 16.2*   < > 15.1* 14.6 14.4 16.2* 17.7* 17.8*  HCT 52.1*   < > 45.2 44.9 45.6 49.3* 52.0* 53.5*  MCV 97.7   < > 92.6 94.7 95.4 93.4  --  92.2  PLT 247   < > 258 202 168 176  --  246   < > = values in this interval not displayed.    Cardiac Enzymes: No results for input(s): CKTOTAL, CKMB, CKMBINDEX, TROPONINI in the last 168 hours.  BNP: BNP (last 3 results) Recent Labs    06/20/21 0920  BNP 2,324.8*    ProBNP (last 3 results) No results for input(s): PROBNP in the last 8760 hours.    Other  results:  Imaging: No results found.   Medications:     Scheduled Medications:  apixaban  5 mg Oral BID   arformoterol  15 mcg Nebulization BID   atorvastatin  80 mg Oral Daily   budesonide (PULMICORT) nebulizer solution  0.5 mg Nebulization BID   busPIRone  10 mg Oral BID   Chlorhexidine Gluconate Cloth  6 each Topical Daily   digoxin  0.125 mg Oral Daily   docusate sodium  100 mg Oral Daily   empagliflozin  10 mg Oral Daily   lamoTRIgine  25 mg Oral Daily   losartan  12.5 mg Oral Daily   mouth rinse  15 mL Mouth Rinse BID   montelukast  10 mg Oral QHS   polyethylene glycol  17 g Oral Daily   potassium chloride  40 mEq Oral BID   QUEtiapine  25 mg Oral BID   revefenacin  175 mcg Nebulization Daily   sodium chloride flush  3 mL Intravenous Q12H   sodium chloride flush  3 mL Intravenous Q12H   spironolactone  25 mg Oral Daily    Infusions:  sodium chloride     sodium chloride 20 mL/hr at 06/25/21 0600   sodium chloride Stopped (06/20/21 2113)   sodium chloride Stopped (06/22/21 1251)   dexmedetomidine (PRECEDEX) IV infusion Stopped (06/25/21 0431)   norepinephrine (LEVOPHED) Adult infusion 15 mcg/min (06/25/21 0600)   sodium chloride      PRN Medications: sodium chloride, sodium chloride, acetaminophen, haloperidol lactate, ipratropium-albuterol, LORazepam, ondansetron (ZOFRAN) IV, sodium chloride, sodium chloride flush, tiZANidine   Assessment/Plan:   1. Shock --> Cardiogenic +/--septic  - CXR possible PNA. WBC 24 => 16 => 7.3=>7.0=>15. Finished azithromycin + ceftriaxone 5/22.  - Procalcitonin  0.51   - Echo ---> EF 20% possible Takotsubo CMP. BNP 2324 hs trop 585 - Cath 5/18 - Normal cors Filling pressures mildly elevated, CO 4.6, CI 2.2  - Hypotensive in the cath lab. Started on NE - Repeat limited echo 5/20: LV EF improved to 30%, no LV thrombus.  - Now off milrinone. Co-ox 91%. Repeat pending  - Back on NE 11>>Hypotension w/ precedex started for  delirium.  ? Recurrent infection    - CVP 4. Hold diuretics today   - Hold spiro and losartan today w/ low BP.  - Continue digoxin 0.125.  - Pull swan transducer   2. Acute systolic HF due to NICM - cath 5/18 normal cors. 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  - Plan as above.  - eventual cMRI when delirium resolves   3 Acute Hypoxic Respiratory Failure multifactorial (HF/AECOPD) -Initially on Bipap --> [place]   -Given steroids, antibiotics, nebs.  -CCM following -severe underlying COPD (FEV1 24%)  4. Probable Apical Thrombus  -Continue heparin -Prior echo not definitive for apical thrombus, will order limited echo with contrast to define thrombus => unable to clearly exclude thrombus on 5/20 echo. Given severe apical WMA have decided to [facility] with Eliquis for several weeks    5. DMII  - SSI - Continue Jardiance   6. Bipolar Disorder/Anxiety  - takes prn Xanax at home  7. H/O Seizure   - follow  8. Delirium  - ? Infection, WBC 7>>15K. Back on NE  - check UA - pull swan transducer  - check Blood Cx    Length of Stay: 5   Brittainy Simmons PA-C  06/25/2021, 8:03 AM  Advanced Heart Failure Team Pager [phone] (M-F; 7a - 4p)  Please contact [facility] for night-coverage after hours (4p -7a ) and weekends on [email or web address]   Agitated and paranoid lat night. Meds adjusted by CCM. Seroquel added.   Now on NE for BP support but weaning.   Not answering questions. Telling me to get away from her.   Does not appear SOB. Denies CP  General:  Sitting up in bed No resp difficulty HEENT: normal Neck: supple. no JVD. Carotids 2+ bilat; no bruits. No lymphadenopathy or thryomegaly appreciated. Cor: PMI nondisplaced. Tachy regular  No rubs, gallops or murmurs. Lungs: clear Abdomen: soft, nontender, nondistended. No hepatosplenomegaly. No bruits or masses. Good bowel sounds. Extremities: no cyanosis, clubbing, rash, edema Neuro: alert paranoid   Worsening psychosis/delirium this am. Also  question of possible underlying infection. Will check UA. Remove introducer. Holding cardiac meds for now.  Wean NE as tolerated.   CCM to assume care (thank you). We will follow as well.   CRITICAL CARE Performed by: Arvilla Meres  Total critical care time: 35 minutes  Critical care time was exclusive of separately billable procedures and treating other patients.  Critical care was necessary to treat or prevent imminent or life-threatening deterioration.  Critical care was time spent personally by me (independent of midlevel providers or residents) on the following activities: development of treatment plan with patient and/or surrogate as well as nursing, discussions with consultants, evaluation of patient's response to treatment, examination of patient, obtaining history from patient or surrogate, ordering and performing treatments and interventions, ordering and review of laboratory studies, ordering and review of radiographic studies, pulse oximetry and re-evaluation of patient's condition.  Arvilla Meres, MD  9:30 AM

## 2021-06-25 NOTE — Plan of Care (Signed)
  Problem: Cardiac: Goal: Ability to achieve and maintain adequate cardiopulmonary perfusion will improve Outcome: Not Progressing   Problem: Activity: Goal: Capacity to carry out activities will improve Outcome: Not Progressing   Problem: Safety: Goal: Non-violent Restraint(s) Outcome: Progressing

## 2021-06-25 NOTE — Progress Notes (Incomplete)
Echo attempted. Patient is unwilling to cooperate. RN at bedside.   Karen Perry 06/25/2021, 4:51 PM

## 2021-06-25 NOTE — Progress Notes (Signed)
eLink Physician-Brief Progress Note Patient Name: Karen Perry DOB: 1965-07-04 MRN: [id number]   Date of Service  06/25/2021  HPI/Events of Note  Agitated and refused PM meds Earlier this evening had transient hypotension on Precedex 1  eICU Interventions  Low-dose precedex restarted Given haldol 2mg  IV once Temporary wrist restraints ordered for patient safety x 4 hours     Intervention Category Minor Interventions: Agitation / anxiety - evaluation and management  Melvyn Hommes 06/25/2021, 3:08 AM

## 2021-06-25 NOTE — Progress Notes (Signed)
OT Cancellation Note  Patient Details Name: Karen Perry MRN: [id number] DOB: 10/02/1965   Cancelled Treatment:    Reason Eval/Treat Not Completed: Medical issues which prohibited therapy (Pt agitated. Nsg asking to hold at this time. Will check back later time.)  [facility] 06/25/2021, 8:24 AM Luisa Dago, OT/L   Acute OT Clinical Specialist Acute Rehabilitation Services Pager [phone] Office [phone]

## 2021-06-25 NOTE — Progress Notes (Signed)
   NAME:  Karen Perry, MRN:  [id number], DOB:  01-Mar-1965, LOS: 5 ADMISSION DATE:  06/20/2021, CONSULTATION DATE:  06/20/21 REFERRING MD:  Bensimhon - Adv HF, CHIEF COMPLAINT:  SOB   History of Present Illness:   56 yo F PMH COPD, anxiety, bipolar disorder, HTN, seizures presented to ED 5/18 with SOB and chest tightness. She was placed on Bipap in ED with concern for pulm edema, given steroids, rocephin and azithro for possible AECOPD.   ECHO revealed EF 20-25%-- with concern for possible ischemic disease, taken urgently for cath. Received lasix in ED.   In cath lab, pt on [place]. Developed worsening SOB during case.  PCCM consulted in this setting   Pertinent  Medical History  COPD Bipolar Seizure HTN Anxiety  Significant Hospital Events: Including procedures, antibiotic start and stop dates in addition to other pertinent events   5/18 admit to [facility]. SOB -- concerning for acute systolic HF, pulm edema.  Taken to cath lab for concern for ischemia. Worse resp status, PCCM consult 5/22 worsening delirium  Interim History / Subjective:  More confused and paranoid through yesterday and today. Now on precedex. Family at bedside.  Objective   Blood pressure (!) 125/94, pulse (!) 112, temperature 97.7 F (36.5 C), temperature source Axillary, resp. rate (!) 21, height 5\' 9"  (1.753 m), weight 71.6 kg, last menstrual period 10/16/2014, SpO2 96 %. PAP: (40-49)/(21-25) 44/24 CVP:  [4 mmHg-41 mmHg] 41 mmHg      Intake/Output Summary (Last 24 hours) at 06/25/2021 0727 Last data filed at 06/25/2021 0600 Gross per 24 hour  Intake 956.85 ml  Output 2200 ml  Net -1243.15 ml    Filed Weights   06/23/21 0500 06/24/21 0600 06/25/21 0600  Weight: 75.8 kg 72.3 kg 71.6 kg    Examination: Chronically ill woman in NAD +muscle wasting AO to self and place not situation No edema Lungs with minimal crackles Heart tachycardic, strong peripheral pulses  Coox 91%? K low No new chest  imaging New white count  Resolved Hospital Problem list     Assessment & Plan:   Acute hypoxemic respiratory failure- improving, seems more driven by pulmonary edema Severe COPD FEV1 24% of predicted, seems to have a resolved exacerbation, do not see prednisone ordered and breathlessness improving with diuresis Acute decompensated HFrEF exacerbation EF 20% Cardiogenic shock resolved ICU delirium- predisposed by underlying GAD+bipolar, currently needing precedex, QT okay a couple days ago DM2, currently controlled WBC count- ?UTI leading to delerium  Continue diuresis as tolerated by renal function: autodiuresing Recheck echo, my suspicion is EF is much better now, can probably start GDMT Nebs as ordered Start seroquel, check AM EKG, wean precedex, mitts for safety Encourage day/night cycle Needs to work with PT Continue to push O2 wean, may need some for home but per family was close to this already Check UA, low threshold to treat empirically for UTI  34 min cc time (precedex titration for ICU delirium)  06/27/21 MD PCCM

## 2021-06-25 NOTE — Progress Notes (Signed)
Pt refusing bipap for the night. ?

## 2021-06-25 NOTE — Progress Notes (Signed)
Echo attempted. Unable to complete echo due to patient being uncooperative, even after nurse gave medication. Nurse aware. Please contact department when patient is moore cooperative.

## 2021-06-26 ENCOUNTER — Inpatient Hospital Stay (HOSPITAL_COMMUNITY): Payer: Medicare HMO

## 2021-06-26 DIAGNOSIS — J9601 Acute respiratory failure with hypoxia: Secondary | ICD-10-CM | POA: Diagnosis not present

## 2021-06-26 DIAGNOSIS — I428 Other cardiomyopathies: Secondary | ICD-10-CM

## 2021-06-26 LAB — CBC
HCT: 51.1 % — ABNORMAL HIGH (ref 36.0–46.0)
Hemoglobin: 16.6 g/dL — ABNORMAL HIGH (ref 12.0–15.0)
MCH: 30.7 pg (ref 26.0–34.0)
MCHC: 32.5 g/dL (ref 30.0–36.0)
MCV: 94.6 fL (ref 80.0–100.0)
Platelets: 156 10*3/uL (ref 150–400)
RBC: 5.4 MIL/uL — ABNORMAL HIGH (ref 3.87–5.11)
RDW: 13.7 % (ref 11.5–15.5)
WBC: 12.8 10*3/uL — ABNORMAL HIGH (ref 4.0–10.5)
nRBC: 0 % (ref 0.0–0.2)

## 2021-06-26 LAB — MAGNESIUM: Magnesium: 2.5 mg/dL — ABNORMAL HIGH (ref 1.7–2.4)

## 2021-06-26 LAB — COMPREHENSIVE METABOLIC PANEL
ALT: 32 U/L (ref 0–44)
AST: 25 U/L (ref 15–41)
Albumin: 3.2 g/dL — ABNORMAL LOW (ref 3.5–5.0)
Alkaline Phosphatase: 66 U/L (ref 38–126)
Anion gap: 10 (ref 5–15)
BUN: 26 mg/dL — ABNORMAL HIGH (ref 6–20)
CO2: 29 mmol/L (ref 22–32)
Calcium: 9.4 mg/dL (ref 8.9–10.3)
Chloride: 104 mmol/L (ref 98–111)
Creatinine, Ser: 0.96 mg/dL (ref 0.44–1.00)
GFR, Estimated: >60 mL/min (ref >60)
Glucose, Bld: 97 mg/dL (ref 70–99)
Potassium: 3.6 mmol/L (ref 3.5–5.1)
Sodium: 143 mmol/L (ref 135–145)
Total Bilirubin: 1.2 mg/dL (ref 0.3–1.2)
Total Protein: 6.3 g/dL — ABNORMAL LOW (ref 6.5–8.1)

## 2021-06-26 LAB — GLUCOSE, CAPILLARY
Glucose-Capillary: 101 mg/dL — ABNORMAL HIGH (ref 70–99)
Glucose-Capillary: 119 mg/dL — ABNORMAL HIGH (ref 70–99)
Glucose-Capillary: 135 mg/dL — ABNORMAL HIGH (ref 70–99)
Glucose-Capillary: 132 mg/dL — ABNORMAL HIGH (ref 70–99)
Glucose-Capillary: 115 mg/dL — ABNORMAL HIGH (ref 70–99)

## 2021-06-26 LAB — COOXEMETRY PANEL
Carboxyhemoglobin: 2.1 % — ABNORMAL HIGH (ref 0.5–1.5)
Methemoglobin: 0.7 % (ref 0.0–1.5)
O2 Saturation: 87.8 %
Total hemoglobin: 17 g/dL — ABNORMAL HIGH (ref 12.0–16.0)

## 2021-06-26 MED ORDER — ENSURE ENLIVE PO LIQD
237.0000 mL | Freq: Three times a day (TID) | ORAL | Status: DC
  Administered 2021-06-26 – 2021-06-27 (×3): 237 mL via ORAL

## 2021-06-26 MED ORDER — ADULT MULTIVITAMIN W/MINERALS CH
1.0000 | ORAL_TABLET | Freq: Every day | ORAL | Status: DC
  Administered 2021-06-26 – 2021-06-27 (×2): 1 via ORAL
  Filled 2021-06-26 (×2): qty 1

## 2021-06-26 NOTE — Progress Notes (Signed)
NAME:  Karen Perry, MRN:  [id number], DOB:  [birth year], LOS: 6 ADMISSION DATE:  06/20/2021, CONSULTATION DATE:  06/20/21 REFERRING MD:  Bensimhon - Adv HF, CHIEF COMPLAINT:  SOB   History of Present Illness:   56 yo F PMH COPD, anxiety, bipolar disorder, HTN, seizures presented to ED 5/18 with SOB and chest tightness. She was placed on Bipap in ED with concern for pulm edema, given steroids, rocephin and azithro for possible AECOPD.   ECHO revealed EF 20-25%-- with concern for possible ischemic disease, taken urgently for cath. Received lasix in ED.   In cath lab, pt on [place]. Developed worsening SOB during case.  PCCM consulted in this setting   Pertinent  Medical History  COPD Bipolar Seizure HTN Anxiety  Significant Hospital Events: Including procedures, antibiotic start and stop dates in addition to other pertinent events   5/18 admit to [facility]. SOB -- concerning for acute systolic HF, pulm edema.  Taken to cath lab for concern for ischemia. Worse resp status, PCCM consult 5/22 Worsening delirium 5/24 Alert and oriented x2 slightly confused to situation.   Interim History / Subjective:  States she feels well this apart from mild cognitive "fuzziness" Husband at bedside and updated   Objective   Blood pressure 94/71, pulse (!) 101, temperature (!) 97.5 F (36.4 C), temperature source Oral, resp. rate 13, height 5\' 9"  (1.753 m), weight 71.1 kg, last menstrual period 10/16/2014, SpO2 95 %. CVP:  [7 mmHg-8 mmHg] 8 mmHg      Intake/Output Summary (Last 24 hours) at 06/26/2021 06/28/2021 Last data filed at 06/26/2021 0200 Gross per 24 hour  Intake 136.17 ml  Output 650 ml  Net -513.83 ml    Filed Weights   06/24/21 0600 06/25/21 0600 06/26/21 0412  Weight: 72.3 kg 71.6 kg 71.1 kg    Examination: General: Acute on chronically ill appearing middle aged female lying in bed, in NAD HEENT: [place]/AT, MM pink/moist, PERRL,  Neuro: Alert and oriented x2, mildly confused to  situation, non-focal  CV: s1s2 regular rate and rhythm, no murmur, rubs, or gallops,  PULM:  Clear to ascultation, no increased work of breathing, no added breath sounds, on 2L  GI: soft, bowel sounds active in all 4 quadrants, non-tender, non-distended, tolerating oral diet Extremities: warm/dry, no edema  Skin: no rashes or lesions  Resolved Hospital Problem list   Cardiogenic shock resolved  Assessment & Plan:   Acute hypoxemic respiratory failure -Iimproving, seems more driven by pulmonary edema Severe COPD FEV1 24% of predicted -Seems to have a resolved exacerbation, do not see prednisone ordered and breathlessness improving with diuresis P: Continue to diurese as able, been autodiuresing currently ~4L negative  Ensure adequate pulmonary hygiene  Mobilize as able  Continue BDs  May be oxygen dependent moving forward  Obtain ambulation oxygenation when able   Acute decompensated HFrEF exacerbation EF 20% Concern for likely apical thrombus  -Unable to exclude thrombus on 5/20 ECHO, repeat pending  P: Heart failure following  Continuous telemetry  GDMT as able  Strict intake and output  Daily weight to assess volume status Provide supplemtal oxygen as needed to maintain oxygen saturations above 90% Pressors for MAP goal > 65 Follow repeat ECHO  Continue Eliquis   ICU delirium -Predisposed by underlying GAD+bipolar, currently needing precedex, QT okay a couple days ago -Much improved by 5/24 Hx of Bipolar/anxiety  Hx of seizures P: Maintain neuro protective measures Nutrition and bowel regiment  Seizure precautions  Aspirations precautions  Trial of discontinuation of Seroquel  Delirium precautions   DM2, currently controlled P: Continue SSI and Jardiance  CBG goal 140-180  WBC count -?UTI leading to delerium. UA with rare bacteria negative nitrates but small leukocytes  -Received 3 days of IV Rocephin with no reported urinary symptoms prior to admission   P: Continue to trend CBC and fever curve    Best Practice (right click and "Reselect all SmartList Selections" daily)   Diet/type: Regular consistency (see orders) DVT prophylaxis: other GI prophylaxis: PPI Lines: Central line Foley:  N/A Code Status:  full code Last date of multidisciplinary goals of care discussion: Continue to update patient and family daily   Critical care time: NA  Jalie Eiland D. Tiburcio Pea, NP-C [facility] Personal contact information can be found on Amion  06/26/2021, 7:13 AM

## 2021-06-26 NOTE — Evaluation (Signed)
Physical Therapy Evaluation Patient Details Name: Karen Perry MRN: [id number] DOB: 08/07/1965 Today's Date: 06/26/2021  History of Present Illness  Pt is a 56 y.o. female admitted 06/20/21 with SOB and chest tightness; concern for acute systolic HF, pulmonary edema. Concern for ischemia s/p cath lab 5/18 which showed normal filling pressures. Course complicated by worsening respiratory status, delirium. PMH includes COPD, HTN, seizures, bipolar disorder, anxiety.   Clinical Impression  Pt presents with an overall decrease in functional mobility secondary to above. PTA, pt reports mod indep with mobility and ADLs using SPC; lives with husband who manages all iADLs, driving. Today, pt requiring minA for brief bouts of standing activity, mod-max cues for completing basic tasks (i.e. toileting); pt's main limitations seem related to cognitive status, including difficulty problem solving, decreased awareness and poor attention. Hopeful pt's cognition will continue to clear as mobility increased, allowing pt to progress to return home with assist from family. Will follow acutely to address established goals.     SpO2 90% on RA; SpO2 92% on 2L O2 [place] HR 109 with activity BP 101/79 post-activity    Recommendations for follow up therapy are one component of a multi-disciplinary discharge planning process, led by the attending physician.  Recommendations may be updated based on patient status, additional functional criteria and insurance authorization.  Follow Up Recommendations Home health PT    Assistance Recommended at Discharge Frequent or constant Supervision/Assistance  Patient can return home with the following  A little help with walking and/or transfers;A little help with bathing/dressing/bathroom;Assistance with cooking/housework;Assist for transportation;Help with stairs or ramp for entrance;Direct supervision/assist for medications management;Direct supervision/assist for financial  management    Equipment Recommendations  (TBD)  Recommendations for Other Services   Occupational Therapy   Functional Status Assessment Patient has had a recent decline in their functional status and demonstrates the ability to make significant improvements in function in a reasonable and predictable amount of time.     Precautions / Restrictions Precautions Precautions: Fall Restrictions Weight Bearing Restrictions: No      Mobility  Bed Mobility Overal bed mobility: Needs Assistance Bed Mobility: Supine to Sit     Supine to sit: Supervision, HOB elevated     General bed mobility comments: increased time and effort, assist for line management    Transfers Overall transfer level: Needs assistance Equipment used: 1 person hand held assist, Rolling walker (2 wheels) Transfers: Sit to/from Stand, Bed to chair/wheelchair/BSC Sit to Stand: Min assist, Min guard   Step pivot transfers: Min assist       General transfer comment: initial stand and step pivot from EOB to recliner (~4') with minA for HHA to stabilize; additional sit<>stand from recliner and BSC (over toilet to RW), min guard for balance    Ambulation/Gait Ambulation/Gait assistance: Min guard Gait Distance (Feet): 20 Feet Assistive device: Rolling walker (2 wheels) Gait Pattern/deviations: Step-to pattern, Step-through pattern, Decreased stride length, Trunk flexed Gait velocity: Decreased     General Gait Details: Slow, guarded gait with RW and min guard for balance; pt slow to move requiring cues to complete task; endorses fatigue and feeling overwhelmed  Stairs            Wheelchair Mobility    Modified Rankin (Stroke Patients Only)       Balance Overall balance assessment: Needs assistance   Sitting balance-Leahy Scale: Fair Sitting balance - Comments: able to perform posterior pericare sitting on edge of BSC/toilet without assist     Standing balance-Leahy Scale:  Fair Standing  balance comment: can static stand without UE support, unable to accept challenge                             Pertinent Vitals/Pain Pain Assessment Pain Assessment: Faces Faces Pain Scale: No hurt Pain Intervention(s): Monitored during session    Home Living Family/patient expects to be discharged to:: Private residence Living Arrangements: Spouse/significant other Available Help at Discharge: Family;Available PRN/intermittently Type of Home: House Home Access: Stairs to enter Entrance Stairs-Rails: Right Entrance Stairs-Number of Steps: 3   Home Layout: One level Home Equipment: Cane - single point Additional Comments: Lives with husband who works PRN (sounds like he could be more available at home a few days if needed); two daughters, one lives nearby who could likely also assist    Prior Function Prior Level of Function : Independent/Modified Independent;Needs assist             Mobility Comments: Mod indep ambulating with SPC; does not drive. Reports no hobbies beyond watching TV ADLs Comments: Pt reports mod indep with ADLs; husband manages iADLs (including housework, cooking, driving/errands, med management)     Higher education careers adviser        Extremity/Trunk Assessment   Upper Extremity Assessment Upper Extremity Assessment: Generalized weakness    Lower Extremity Assessment Lower Extremity Assessment: Generalized weakness       Communication   Communication: Expressive difficulties (soft voice)  Cognition Arousal/Alertness: Awake/alert Behavior During Therapy: Flat affect Overall Cognitive Status: Impaired/Different from baseline Area of Impairment: Attention, Memory, Following commands, Safety/judgement, Awareness, Problem solving                   Current Attention Level: Sustained Memory: Decreased short-term memory Following Commands: Follows one step commands with increased time Safety/Judgement: Decreased awareness of safety, Decreased  awareness of deficits Awareness: Emergent Problem Solving: Slow processing, Decreased initiation, Difficulty sequencing, Requires verbal cues General Comments: pt's father reports baseline "quiet" but still typically more animated. pt with slowed processing and difficulty performing basic tasks today (i.e. pt sitting on toilet to pee, PT checking on her and pt stares blankly saying, "I don't know what I'm supposed to do now" - required cues to finish voiding, get toilet paper, wipe, stand, etc.). pt endorses feeling anxious/overwhelmed by amount of lines attached        General Comments General comments (skin integrity, edema, etc.): pt's father Peyton Najjar) present at beginning of session, supportive. Pt's SpO2 90% on RA, 92% on 2L O2 [place]; post-activity BP 101/79, HR 109. When asked about any symptoms, pt repeats, "I don't know"    Exercises     Assessment/Plan    PT Assessment Patient needs continued PT services  PT Problem List Decreased strength;Decreased activity tolerance;Decreased balance;Decreased mobility;Decreased cognition;Decreased knowledge of use of DME;Cardiopulmonary status limiting activity       PT Treatment Interventions DME instruction;Gait training;Stair training;Functional mobility training;Therapeutic activities;Therapeutic exercise;Balance training;Cognitive remediation;Patient/family education    PT Goals (Current goals can be found in the Care Plan section)  Acute Rehab PT Goals Patient Stated Goal: return home PT Goal Formulation: With patient Time For Goal Achievement: 07/10/21 Potential to Achieve Goals: Good    Frequency Min 3X/week     Co-evaluation               AM-PAC PT "6 Clicks" Mobility  Outcome Measure Help needed turning from your back to your side while in a flat bed without using  bedrails?: A Little Help needed moving from lying on your back to sitting on the side of a flat bed without using bedrails?: A Little Help needed moving to and  from a bed to a chair (including a wheelchair)?: A Little Help needed standing up from a chair using your arms (e.g., wheelchair or bedside chair)?: A Little Help needed to walk in hospital room?: Total Help needed climbing 3-5 steps with a railing? : Total 6 Click Score: 14    End of Session   Activity Tolerance: Patient tolerated treatment well;Patient limited by fatigue Patient left: in chair;with call bell/phone within reach Nurse Communication: Mobility status PT Visit Diagnosis: Other abnormalities of gait and mobility (R26.89);Muscle weakness (generalized) (M62.81)    Time: 2297-9892 PT Time Calculation (min) (ACUTE ONLY): 39 min   Charges:   PT Evaluation $PT Eval Moderate Complexity: 1 Mod PT Treatments $Therapeutic Activity: 8-22 mins      Ina Homes, PT, DPT Acute Rehabilitation Services  Pager [phone] Office [phone]  Malachy Chamber 06/26/2021, 12:44 PM

## 2021-06-26 NOTE — Progress Notes (Signed)
Echocardiogram 2D Echocardiogram has been performed.  Joette Catching 06/26/2021, 9:34 AM

## 2021-06-26 NOTE — Progress Notes (Signed)
Brief Nutrition Follow-up:  Pt mental status improving; no longer refusing care, meds or po intake.   PO intake still remains poor. Ate most of her eggs this morning but that is all.   Now that pt is more oriented and cooperative, plan to add nutrition interventions by mouth. Hold on Cortrak recommendation for now.  Interventions: Ensure Enlive po TID, each supplement provides 350 kcal and 20 grams of protein. Continue Regular Diet Add MVI with minerals daily  Romelle Starcher MS, RDN, LDN, CNSC Registered Dietitian III Clinical Nutrition RD Pager and On-Call Pager Number Located in [place]

## 2021-06-26 NOTE — Evaluation (Signed)
Occupational Therapy Evaluation Patient Details Name: Karen Perry MRN: [id number] DOB: [birth year] Today's Date: 06/26/2021   History of Present Illness Pt is a 56 y.o. female admitted 06/20/21 with SOB and chest tightness; concern for acute systolic HF, pulmonary edema. Concern for ischemia s/p cath lab 5/18 which showed normal filling pressures. Course complicated by worsening respiratory status, delirium. PMH includes COPD, HTN, seizures, bipolar disorder, anxiety.   Clinical Impression   Patient admitted for the diagnosis above.  PTA she lives with her spouse, who ides assist as needed with IADL and community mobility.  Currently she is needing Min A for basic mobility, and ADL completion from a sit/stand level.  Declines to cognition, poor dynamic balance, and generalized weakness  are the primary deficits impacting independence.  OT will follow in the acute setting, and as her confusion clears, home with assist and [facility] OT is recommended.       Recommendations for follow up therapy are one component of a multi-disciplinary discharge planning process, led by the attending physician.  Recommendations may be updated based on patient status, additional functional criteria and insurance authorization.   Follow Up Recommendations  Home health OT    Assistance Recommended at Discharge Intermittent Supervision/Assistance  Patient can return home with the following      Functional Status Assessment  Patient has had a recent decline in their functional status and demonstrates the ability to make significant improvements in function in a reasonable and predictable amount of time.  Equipment Recommendations  None recommended by OT    Recommendations for Other Services       Precautions / Restrictions Precautions Precautions: Fall Restrictions Weight Bearing Restrictions: No      Mobility Bed Mobility Overal bed mobility: Needs Assistance               Patient Response:  Cooperative  Transfers Overall transfer level: Needs assistance Equipment used: Rolling walker (2 wheels) Transfers: Sit to/from Stand, Bed to chair/wheelchair/BSC Sit to Stand: Min assist     Step pivot transfers: Min assist            Balance Overall balance assessment: Needs assistance   Sitting balance-Leahy Scale: Fair     Standing balance support: Bilateral upper extremity supported Standing balance-Leahy Scale: Fair                             ADL either performed or assessed with clinical judgement   ADL       Grooming: Wash/dry hands;Wash/dry face;Set up;Sitting           Upper Body Dressing : Minimal assistance;Sitting   Lower Body Dressing: Minimal assistance;Sit to/from stand   Toilet Transfer: Minimal assistance;Rolling walker (2 wheels);Ambulation                   Vision Patient Visual Report: No change from baseline       Perception Perception Perception: Not tested   Praxis Praxis Praxis: Not tested    Pertinent Vitals/Pain Pain Assessment Pain Assessment: Faces Faces Pain Scale: No hurt Pain Intervention(s): Monitored during session     Hand Dominance Right   Extremity/Trunk Assessment Upper Extremity Assessment Upper Extremity Assessment: Generalized weakness   Lower Extremity Assessment Lower Extremity Assessment: Defer to PT evaluation   Cervical / Trunk Assessment Cervical / Trunk Assessment: Kyphotic   Communication Communication Communication: No difficulties   Cognition Arousal/Alertness: Awake/alert Behavior During Therapy: Flat affect Overall Cognitive  Status: Impaired/Different from baseline Area of Impairment: Attention, Memory, Following commands, Safety/judgement, Awareness, Problem solving                   Current Attention Level: Sustained Memory: Decreased short-term memory Following Commands: Follows one step commands with increased time Safety/Judgement: Decreased  awareness of safety, Decreased awareness of deficits Awareness: Emergent Problem Solving: Slow processing, Decreased initiation, Difficulty sequencing, Requires verbal cues       General Comments  HR to 104 with mobility.    Exercises     Shoulder Instructions      Home Living Family/patient expects to be discharged to:: Private residence Living Arrangements: Spouse/significant other Available Help at Discharge: Family;Available PRN/intermittently Type of Home: House Home Access: Stairs to enter [employer] of Steps: 3 Entrance Stairs-Rails: Right Home Layout: One level     Bathroom Shower/Tub: Chief Strategy Officer: Standard Bathroom Accessibility: Yes How Accessible: Accessible via walker Home Equipment: Cane - single point   Additional Comments: Lives with husband who works PRN (sounds like he could be more available at home a few days if needed); two daughters, one lives nearby who could likely also assist      Prior Functioning/Environment Prior Level of Function : Independent/Modified Independent;Needs assist             Mobility Comments: Mod indep ambulating with SPC; does not drive. Reports no hobbies beyond watching TV ADLs Comments: Pt reports mod indep with ADLs; husband manages iADLs (including housework, cooking, driving/errands, med management)        OT Problem List: Decreased strength;Decreased activity tolerance;Impaired balance (sitting and/or standing);Decreased cognition      OT Treatment/Interventions: Self-care/ADL training;Therapeutic exercise;Therapeutic activities;Cognitive remediation/compensation;Patient/family education;Balance training;DME and/or AE instruction    OT Goals(Current goals can be found in the care plan section) Acute Rehab OT Goals Patient Stated Goal: Hoping to transition home OT Goal Formulation: With patient Time For Goal Achievement: 07/10/21 Potential to Achieve Goals: Good ADL Goals Pt  Will Perform Grooming: with set-up;standing Pt Will Perform Upper Body Dressing: with set-up;sitting Pt Will Perform Lower Body Dressing: with supervision;sit to/from stand Pt Will Transfer to Toilet: with supervision;ambulating;regular height toilet Pt/caregiver will Perform Home Exercise Program: Increased strength;Both right and left upper extremity;With theraband;With written HEP provided;With Supervision  OT Frequency: Min 2X/week    Co-evaluation              AM-PAC OT "6 Clicks" Daily Activity     Outcome Measure Help from another person eating meals?: None Help from another person taking care of personal grooming?: None Help from another person toileting, which includes using toliet, bedpan, or urinal?: A Little Help from another person bathing (including washing, rinsing, drying)?: A Little Help from another person to put on and taking off regular upper body clothing?: A Little Help from another person to put on and taking off regular lower body clothing?: A Little 6 Click Score: 20   End of Session Equipment Utilized During Treatment: Rolling walker (2 wheels);Oxygen Nurse Communication: Mobility status  Activity Tolerance: Patient tolerated treatment well Patient left: in chair;with call bell/phone within reach;with family/visitor present  OT Visit Diagnosis: Unsteadiness on feet (R26.81);Muscle weakness (generalized) (M62.81);Other symptoms and signs involving cognitive function                Time: 1429-1449 OT Time Calculation (min): 20 min Charges:  OT General Charges $OT Visit: 1 Visit OT Evaluation $OT Eval Moderate Complexity: 1 Mod  06/26/2021  RP, OTR/L  Acute Rehabilitation Services  Office:  [phone]   Suzanna Obey 06/26/2021, 3:14 PM

## 2021-06-27 ENCOUNTER — Other Ambulatory Visit (HOSPITAL_COMMUNITY): Payer: Self-pay

## 2021-06-27 DIAGNOSIS — J9601 Acute respiratory failure with hypoxia: Secondary | ICD-10-CM

## 2021-06-27 LAB — BASIC METABOLIC PANEL
Anion gap: 12 (ref 5–15)
BUN: 25 mg/dL — ABNORMAL HIGH (ref 6–20)
CO2: 25 mmol/L (ref 22–32)
Calcium: 9.2 mg/dL (ref 8.9–10.3)
Chloride: 101 mmol/L (ref 98–111)
Creatinine, Ser: 0.79 mg/dL (ref 0.44–1.00)
GFR, Estimated: >60 mL/min (ref >60)
Glucose, Bld: 87 mg/dL (ref 70–99)
Potassium: 4.4 mmol/L (ref 3.5–5.1)
Sodium: 138 mmol/L (ref 135–145)

## 2021-06-27 LAB — ECHOCARDIOGRAM LIMITED
Calc EF: 54.3 %
Height: 69 in
S' Lateral: 1.95 cm
Single Plane A2C EF: 48.1 %
Single Plane A4C EF: 59.9 %
Weight: 2507.95 oz

## 2021-06-27 LAB — CBC
HCT: 48.8 % — ABNORMAL HIGH (ref 36.0–46.0)
Hemoglobin: 15.6 g/dL — ABNORMAL HIGH (ref 12.0–15.0)
MCH: 30.4 pg (ref 26.0–34.0)
MCHC: 32 g/dL (ref 30.0–36.0)
MCV: 94.9 fL (ref 80.0–100.0)
Platelets: 152 10*3/uL (ref 150–400)
RBC: 5.14 MIL/uL — ABNORMAL HIGH (ref 3.87–5.11)
RDW: 13.4 % (ref 11.5–15.5)
WBC: 14.7 10*3/uL — ABNORMAL HIGH (ref 4.0–10.5)
nRBC: 0 % (ref 0.0–0.2)

## 2021-06-27 LAB — COOXEMETRY PANEL
Carboxyhemoglobin: 2.1 % — ABNORMAL HIGH (ref 0.5–1.5)
Methemoglobin: 1 % (ref 0.0–1.5)
O2 Saturation: 96.1 %
Total hemoglobin: 16.1 g/dL — ABNORMAL HIGH (ref 12.0–16.0)

## 2021-06-27 LAB — GLUCOSE, CAPILLARY
Glucose-Capillary: 100 mg/dL — ABNORMAL HIGH (ref 70–99)
Glucose-Capillary: 94 mg/dL (ref 70–99)

## 2021-06-27 LAB — MAGNESIUM: Magnesium: 2.3 mg/dL (ref 1.7–2.4)

## 2021-06-27 MED ORDER — SPIRONOLACTONE 25 MG PO TABS: 12.5000 mg | ORAL_TABLET | Freq: Every day | ORAL | 0 refills | Status: DC

## 2021-06-27 MED ORDER — LOSARTAN POTASSIUM 25 MG PO TABS
12.5000 mg | ORAL_TABLET | Freq: Every day | ORAL | 0 refills | Status: DC
  Filled 2021-06-27: qty 30, 60d supply, fill #0

## 2021-06-27 MED ORDER — EMPAGLIFLOZIN 10 MG PO TABS: 10.0000 mg | ORAL_TABLET | Freq: Every day | ORAL | 0 refills | Status: DC

## 2021-06-27 MED ORDER — DIGOXIN 125 MCG PO TABS
0.1250 mg | ORAL_TABLET | Freq: Every day | ORAL | 0 refills | Status: DC
Start: 2021-06-28 — End: 2021-06-27
  Filled 2021-06-27: qty 30, 30d supply, fill #0

## 2021-06-27 MED ORDER — LOSARTAN POTASSIUM 25 MG PO TABS: 12.5000 mg | ORAL_TABLET | Freq: Every day | ORAL | 0 refills | Status: DC

## 2021-06-27 MED ORDER — SPIRONOLACTONE 12.5 MG HALF TABLET
12.5000 mg | ORAL_TABLET | Freq: Every day | ORAL | Status: DC
  Administered 2021-06-27: 12.5 mg via ORAL
  Filled 2021-06-27: qty 1

## 2021-06-27 MED ORDER — LOSARTAN POTASSIUM 25 MG PO TABS
12.5000 mg | ORAL_TABLET | Freq: Every day | ORAL | Status: DC
  Administered 2021-06-27: 12.5 mg via ORAL
  Filled 2021-06-27: qty 1

## 2021-06-27 MED ORDER — DIGOXIN 125 MCG PO TABS: 0.1250 mg | ORAL_TABLET | Freq: Every day | ORAL | 0 refills | Status: DC

## 2021-06-27 MED ORDER — APIXABAN 5 MG PO TABS: 5.0000 mg | ORAL_TABLET | Freq: Two times a day (BID) | ORAL | 0 refills | Status: DC

## 2021-06-27 MED ORDER — APIXABAN 5 MG PO TABS
5.0000 mg | ORAL_TABLET | Freq: Two times a day (BID) | ORAL | 0 refills | Status: DC
  Filled 2021-06-27: qty 60, 30d supply, fill #0

## 2021-06-27 MED ORDER — SPIRONOLACTONE 25 MG PO TABS
12.5000 mg | ORAL_TABLET | Freq: Every day | ORAL | 0 refills | Status: DC
  Filled 2021-06-27: qty 30, 60d supply, fill #0

## 2021-06-27 MED ORDER — EMPAGLIFLOZIN 10 MG PO TABS
10.0000 mg | ORAL_TABLET | Freq: Every day | ORAL | 0 refills | Status: DC
  Filled 2021-06-27: qty 30, 30d supply, fill #0

## 2021-06-27 NOTE — TOC Benefit Eligibility Note (Signed)
Patient Advocate Encounter  Insurance verification completed.    The patient is currently admitted and upon discharge could be taking Eliquis 5 mg.  The current 30 day co-pay is, $45.00.   The patient is insured through [employer] Medicare Part D     Alizabeth Antonio, CPhT Pharmacy Patient Advocate Specialist [facility] Patient Advocate Team Direct Number: [phone]  Fax: [phone]        

## 2021-06-27 NOTE — Plan of Care (Signed)
  Problem: Education: Goal: Ability to verbalize understanding of medication therapies will improve Outcome: Not Progressing   Problem: Education: Goal: Ability to demonstrate management of disease process will improve Outcome: Progressing Goal: Individualized Educational Video(s) Outcome: Progressing   Problem: Activity: Goal: Capacity to carry out activities will improve Outcome: Progressing   Problem: Cardiac: Goal: Ability to achieve and maintain adequate cardiopulmonary perfusion will improve Outcome: Progressing   Problem: Safety: Goal: Non-violent Restraint(s) Outcome: Progressing

## 2021-06-27 NOTE — Progress Notes (Signed)
PM addendum  ECHO 5/24  EF improved 60%  Per MD ok to stop digoxin and apixaban    Bonnita Nasuti Pharm.D. CPP, BCPS Clinical Pharmacist [phone] 06/27/2021 2:34 PM

## 2021-06-27 NOTE — Progress Notes (Addendum)
SATURATION QUALIFICATIONS: (This note is used to comply with regulatory documentation for home oxygen)  Patient Saturations on Room Air at Rest = 95%  Patient Saturations on Room Air while Ambulating = 92%   Please briefly explain why patient needs home oxygen: Pt SpO2 92-97% on RA with exercise (gait/stair negotiation) during PT session when good pleth signal achieved. Pt not dyspneic. SpO2 reading 92% post-exertion while pt seated in chair, RN notified.

## 2021-06-27 NOTE — TOC Initial Note (Addendum)
Transition of Care [facility]) - Initial/Assessment Note    Patient Details  Name: Karen Perry MRN: [id number] Date of Birth: 09-23-1965  [facility] CM/SW Contact:    Elliot Cousin, RN Phone Number: [phone] 06/27/2021, 12:17 PM  Clinical Narrative:                 HF TOC CM spoke to pt at bedside. Offered choice for [facility], provided pt with medicare.gov list with ratings and placed copy in chart. Pt requested [facility] and CM contacted rep, Cory with new referral. Pt wanted to save Medicare benefit for DME. Her mother has a RW she can use at home.   Medications reviewed and meds to be sent up from [facility] pharmacy. PT will check ambulation walking saturation.   Expected Discharge Plan: Home w Home Health Services Barriers to Discharge: No Barriers Identified   Patient Goals and CMS Choice Patient states their goals for this hospitalization and ongoing recovery are:: wants to go home CMS Medicare.gov Compare Post Acute Care list provided to:: Patient Choice offered to / list presented to : Patient, Parent  Expected Discharge Plan and Services Expected Discharge Plan: Home w Home Health Services   Discharge Planning Services: CM Consult Post Acute Care Choice: Home Health Living arrangements for the past 2 months: Single Family Home Expected Discharge Date: 06/27/21                         HH Arranged: RN, PT HH Agency[facility] Contacted: 06/27/21 Time HH Agency Contacted: 1213 Representative spoke with at [facility] Agency: Delila Spence  Prior Living Arrangements/Services Living arrangements for the past 2 months: Single Family Home Lives with:: Spouse Patient language and need for interpreter reviewed:: Yes Do you feel safe going back to the place where you live?: Yes      Need for Family Participation in Patient Care: Yes (Comment) Care giver support system in place?: Yes (comment) Current home services: DME (cane,  scale) Criminal Activity/Legal Involvement Pertinent to Current Situation/Hospitalization: No - Comment as needed  Activities of Daily Living      Permission Sought/Granted Permission sought to share information with : Case Manager, Family Supports, PCP Permission granted to share information with : Yes, Verbal Permission Granted  Share Information with NAME: Sheresa Cullop  Permission granted to share info w AGENCY: Home Health, DME  Permission granted to share info w Relationship: husband  Permission granted to share info w Contact Information: [phone]  Emotional Assessment Appearance:: Appears stated age Attitude/Demeanor/Rapport: Engaged Affect (typically observed): Accepting Orientation: : Oriented to Self, Oriented to Place, Oriented to  Time, Oriented to Situation   Psych Involvement: No (comment)  Admission diagnosis:  COPD exacerbation (HCC) [J44.1] Acute respiratory failure with hypoxia (HCC) [J96.01] Acute on chronic respiratory failure with hypoxia (HCC) [J96.21] Acute congestive heart failure, unspecified heart failure type [facility]) [I50.9] Patient Active Problem List   Diagnosis Date Noted   Acute respiratory failure with hypoxia (HCC) 06/20/2021   CHF exacerbation (HCC) 06/20/2021   Elevated troponin 06/20/2021   SIRS (systemic inflammatory response syndrome) (HCC) 06/20/2021   Essential hypertension 06/20/2021   Moderate persistent asthma 06/20/2021   Hyperlipidemia 06/20/2021   Anxiety 06/20/2021   Aspiration pneumonia (HCC) 06/20/2021   Acute combined systolic and diastolic heart failure (HCC)    PCP:  Lynnea Ferrier, MD Pharmacy:   [facility] [address] [facility],   - [address] [facility] [place] [address] Phone: [phone] Fax: [phone]  [facility] [place], [place] - [address] [place] [address] Phone: [phone] Fax: [phone]  [facility] [address] [place] [address] Phone: [phone] Fax: [phone]     Social Determinants of Health (SDOH) Interventions    Readmission Risk Interventions     View : No data to display.

## 2021-06-27 NOTE — Discharge Instructions (Signed)
Please keep all scheduled visit Appears you have a apt already scheduled with Dr. Meredeth Ide 07/02/21 and Dr. Graciela Husbands 07/10/21

## 2021-06-27 NOTE — Progress Notes (Signed)
Limited echo from 05/23 resulted after patient seen this am.  EF improved to 60-65%. 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   Recent delirium and significant anxiety. Doubt she would tolerate cMRI or PYP scan at this point. Can pursue further workup as outpatient.   Has f/u in HF clinic.

## 2021-06-27 NOTE — Progress Notes (Signed)
Physical Therapy Treatment Patient Details Name: Karen Perry MRN: [id number] DOB: 1965/08/27 Today's Date: 06/27/2021   History of Present Illness Pt is a 56 y.o. female admitted 06/20/21 with SOB and chest tightness; concern for acute systolic HF, pulmonary edema. Concern for ischemia s/p cath lab 5/18 which showed normal filling pressures. Course complicated by worsening respiratory status, delirium. PMH includes COPD, HTN, seizures, bipolar disorder, anxiety.    PT Comments    Pt received in supine, alert and oriented with slow processing, agreeable to therapy session with good participation. Pt admits to some anxiety regarding mobility but with fair carryover of instruction on safe use of RW and for stair ascent/descent without loss of balance. Pt needing up to minA for stability with stair negotiation and min guard for longer hallway distance gait trial using RW. SpO2 WFL on RA reading 92-97% when good pleth signal achieved, RN notified. Pt with kyphotic posture due to likely scoliosis (not in HPI but noted hip/back deformity) and emphasized importance of OOB posture during day with frequent short bouts of mobility once she gets home for strengthening/improved pulmonary clearance and endurance building, pt/mother receptive. Pt continues to benefit from PT services to progress toward functional mobility goals.    Recommendations for follow up therapy are one component of a multi-disciplinary discharge planning process, led by the attending physician.  Recommendations may be updated based on patient status, additional functional criteria and insurance authorization.  Follow Up Recommendations  Home health PT     Assistance Recommended at Discharge Frequent or constant Supervision/Assistance  Patient can return home with the following A little help with walking and/or transfers;A little help with bathing/dressing/bathroom;Assistance with cooking/housework;Assist for transportation;Help  with stairs or ramp for entrance;Direct supervision/assist for medications management;Direct supervision/assist for financial management   Equipment Recommendations  Rolling walker (2 wheels) (per family they have one she can use)    Recommendations for Other Services       Precautions / Restrictions Precautions Precautions: Fall Precaution Comments: delirium, possible b/b incontinence Restrictions Weight Bearing Restrictions: No     Mobility  Bed Mobility Overal bed mobility: Needs Assistance Bed Mobility: Supine to Sit     Supine to sit: Min guard     General bed mobility comments: increased time and effort, assist for line management; to L EOB, HOB flat/no rails per home setup; min guard for trunk stability.    Transfers Overall transfer level: Needs assistance Equipment used: Rolling walker (2 wheels) Transfers: Sit to/from Stand, Bed to chair/wheelchair/BSC Sit to Stand: Min assist, Min guard           General transfer comment: minA initially with mod cues for safe UE placement to stand from EOB>RW (pt needs mod cues to uncross legs prior to standing, etc), min gaurd standing from toilet using wall rail after sequencing cues given, min guard to sit in recliner from RW.    Ambulation/Gait Ambulation/Gait assistance: Min guard Gait Distance (Feet): 200 Feet Assistive device: Rolling walker (2 wheels) Gait Pattern/deviations: Step-through pattern, Decreased stride length, Trunk flexed, Drifts right/left, Narrow base of support Gait velocity: Decreased     General Gait Details: Slow, guarded gait with RW and min guard for safety, no overt LOB using RW; mod cues for activity pacing, improved/wider BOS, proximity to RW/upright posture and pursed-lip breathing.   Stairs Stairs: Yes Stairs assistance: Min assist Stair Management: Two rails, Forwards, Step to pattern Number of Stairs: 5 General stair comments: cues for step-to pattern for safety, mother present  for instruction on safe sequencing, no overt LOB but increased time needed to perform due to pt slow processing/hesitancy.   Wheelchair Mobility    Modified Rankin (Stroke Patients Only)       Balance Overall balance assessment: Needs assistance   Sitting balance-Leahy Scale: Fair     Standing balance support: Bilateral upper extremity supported, Reliant on assistive device for balance Standing balance-Leahy Scale: Fair Standing balance comment: can static stand with U UE support, reliant on RW for balance with gait                            Cognition Arousal/Alertness: Awake/alert Behavior During Therapy: Flat affect, Anxious Overall Cognitive Status: Impaired/Different from baseline Area of Impairment: Attention, Memory, Following commands, Safety/judgement, Awareness, Problem solving                   Current Attention Level: Sustained Memory: Decreased short-term memory Following Commands: Follows one step commands with increased time Safety/Judgement: Decreased awareness of safety, Decreased awareness of deficits Awareness: Emergent Problem Solving: Slow processing, Decreased initiation, Difficulty sequencing, Requires verbal cues General Comments: Pt with slowed processing and difficulty performing basic tasks today (i.e. pt sitting on toilet to have BM, needs encouragement to attempt her own peri-care and pt ultimately needing assist for posterior peri-care and cues to stand up once she is done). pt endorses feeling anxious/overwhelmed by amount of lines attached. Pt oriented to self/date/location/situation but not at her baseline per her mother, who states "this is all new, normally she can take care of herself at home".        Exercises      General Comments General comments (skin integrity, edema, etc.): SpO2 92-97% on RA with exertion and resting; HR 90-118 bpm with exertion      Pertinent Vitals/Pain Pain Assessment Pain Assessment:  Faces Faces Pain Scale: Hurts a little bit Pain Location: "bottom" Pain Descriptors / Indicators: Discomfort, Sore Pain Intervention(s): Monitored during session, Repositioned, Other (comment) (pt bed noted to be damp and purewick soiled, pt taken to bathroom and assisted to complete toileting after having BM, reports no discomfort once resting in chair)     PT Goals (current goals can now be found in the care plan section) Acute Rehab PT Goals Patient Stated Goal: return home PT Goal Formulation: With patient Time For Goal Achievement: 07/10/21 Progress towards PT goals: Progressing toward goals    Frequency    Min 3X/week      PT Plan Current plan remains appropriate       AM-PAC PT "6 Clicks" Mobility   Outcome Measure  Help needed turning from your back to your side while in a flat bed without using bedrails?: A Little Help needed moving from lying on your back to sitting on the side of a flat bed without using bedrails?: A Little Help needed moving to and from a bed to a chair (including a wheelchair)?: A Little Help needed standing up from a chair using your arms (e.g., wheelchair or bedside chair)?: A Little Help needed to walk in hospital room?: A Lot (mod cues for safety) Help needed climbing 3-5 steps with a railing? : A Lot (mod cues for safety) 6 Click Score: 16    End of Session Equipment Utilized During Treatment: Gait belt Activity Tolerance: Patient tolerated treatment well;Patient limited by fatigue Patient left: in chair;with call bell/phone within reach;with family/visitor present (mother present, pt set up to eat lunch)  Nurse Communication: Mobility status (VSS on RA) PT Visit Diagnosis: Other abnormalities of gait and mobility (R26.89);Muscle weakness (generalized) (M62.81)     Time: 8676-7209 PT Time Calculation (min) (ACUTE ONLY): 40 min  Charges:  $Gait Training: 23-37 mins $Therapeutic Activity: 8-22 mins                     Quinlee Sciarra P.,  PTA Acute Rehabilitation Services Secure Chat Preferred 9a-5:30pm Office: [phone]    Dorathy Kinsman [facility] 06/27/2021, 2:12 PM

## 2021-06-27 NOTE — Consult Note (Signed)
   [facility] Inpatient Consult   06/27/2021  [name] 1965/03/28 [id number]  [facility] [ACO] Patient: [place] Medicare   Primary Care Provider:  Adin Hector, MD, at [facility]    Patient screened for length of stay 7 days hospitalization for potential [place] Management service needs for post hospital transition.  Review of patient's medical record reveals patient is for home today with home health. This Probation officer attempted a call to the bedside phone but there was no answer. Review reveals patient has follow up appointments with PCP and specialist.     Plan:  Patient needs to be met by PCP follow up and specialist no other care coordination/barriers noted at this time.  For questions contact:   Natividad Brood, RN BSN [facility] Liaison  [phone] business mobile phone Toll free office [phone]  Fax number: [phone] [email or web address]

## 2021-06-27 NOTE — Progress Notes (Addendum)
Patient ID: Karen Perry, female   DOB: 29-Dec-1965, 56 y.o.   MRN: [id number]    Advanced Heart Failure Rounding Note   Subjective:    Off all inotropes and pressors.  No longer has central access for CO-OX. Will stop checking.  Feels okay. No dyspnea or CP. Very weak. Having trouble ambulating on her own.  Limited echo with contrast (5/20): Some improvement in EF to 30%, no LV thrombus.    Objective:   Weight Range:  Vital Signs:   Temp:  [97.6 F (36.4 C)-98.8 F (37.1 C)] 98.7 F (37.1 C) (05/25 0700) Pulse Rate:  [86-110] 95 (05/25 0700) Resp:  [12-23] 12 (05/25 0700) BP: (92-131)/(69-82) 114/73 (05/25 0700) SpO2:  [90 %-95 %] 91 % (05/25 0700) Weight:  [74.5 kg] 74.5 kg (05/25 0438) Last BM Date : 06/24/21  Weight change: Filed Weights   06/26/21 0412 06/26/21 2209 06/27/21 0438  Weight: 71.1 kg 74.5 kg 74.5 kg    Intake/Output:   Intake/Output Summary (Last 24 hours) at 06/27/2021 0727 Last data filed at 06/27/2021 0630 Gross per 24 hour  Intake 460 ml  Output 0 ml  Net 460 ml    PHYSICAL EXAM: General:  No distress. Sitting up in bed. HEENT: normal Neck: supple. no JVD. Carotids 2+ bilat; no bruits.  Cor: PMI nondisplaced. Regular rate & rhythm. No rubs, gallops or murmurs. Lungs: clear, O2 stable on RA Abdomen: soft, nontender, nondistended. Extremities: no cyanosis, clubbing, rash, edema Neuro: alert & orientedx3, cranial nerves grossly intact. moves all 4 extremities w/o difficulty. Affect pleasant    Telemetry:  Sinus 80s-90s   Labs: Basic Metabolic Panel: Recent Labs  Lab 06/22/21 0618 06/22/21 1737 06/23/21 0511 06/24/21 0520 06/24/21 2110 06/25/21 0525 06/26/21 0402 06/27/21 0109  NA 142  --  143 143 138 142 143  --   K 3.6   < > 4.0 4.4 3.8 3.8 3.6  --   CL 102  --  101 99  --  99 104  --   CO2 28  --  32 31  --  28 29  --   GLUCOSE 190*  --  157* 148*  --  139* 97  --   BUN 25*  --  23* 22*  --  35* 26*  --    CREATININE 0.79  --  0.79 0.92  --  1.18* 0.96  --   CALCIUM 9.4  --  9.4 9.7  --  9.6 9.4  --   MG  --    < > 2.1 2.3  --  2.5* 2.5* 2.3   < > = values in this interval not displayed.    Liver Function Tests: Recent Labs  Lab 06/22/21 0618 06/23/21 0511 06/24/21 0520 06/25/21 0525 06/26/21 0402  AST 20 31 36 23 25  ALT 38 46* 44 46* 32  ALKPHOS 69 68 69 76 66  BILITOT 1.0 1.0 1.6* 1.4* 1.2  PROT 6.4* 6.3* 7.1 7.1 6.3*  ALBUMIN 3.2* 3.1* 3.4* 3.7 3.2*   No results for input(s): LIPASE, AMYLASE in the last 168 hours. No results for input(s): AMMONIA in the last 168 hours.  CBC: Recent Labs  Lab 06/20/21 0920 06/20/21 0928 06/22/21 0618 06/23/21 0511 06/24/21 0520 06/24/21 2110 06/25/21 0525 06/26/21 0402  WBC 24.4*   < > 15.9* 7.3 7.0  --  15.7* 12.8*  NEUTROABS 20.4*  --   --   --   --   --   --   --  HGB 16.2*   < > 14.6 14.4 16.2* 17.7* 17.8* 16.6*  HCT 52.1*   < > 44.9 45.6 49.3* 52.0* 53.5* 51.1*  MCV 97.7   < > 94.7 95.4 93.4  --  92.2 94.6  PLT 247   < > 202 168 176  --  246 156   < > = values in this interval not displayed.    Cardiac Enzymes: No results for input(s): CKTOTAL, CKMB, CKMBINDEX, TROPONINI in the last 168 hours.  BNP: BNP (last 3 results) Recent Labs    06/20/21 0920  BNP 2,324.8*    ProBNP (last 3 results) No results for input(s): PROBNP in the last 8760 hours.    Other results:  Imaging: No results found.   Medications:     Scheduled Medications:  apixaban  5 mg Oral BID   arformoterol  15 mcg Nebulization BID   atorvastatin  80 mg Oral Daily   budesonide (PULMICORT) nebulizer solution  0.5 mg Nebulization BID   busPIRone  10 mg Oral BID   Chlorhexidine Gluconate Cloth  6 each Topical Daily   digoxin  0.125 mg Oral Daily   docusate sodium  100 mg Oral Daily   empagliflozin  10 mg Oral Daily   feeding supplement  237 mL Oral TID BM   lamoTRIgine  25 mg Oral Daily   mouth rinse  15 mL Mouth Rinse BID    montelukast  10 mg Oral QHS   multivitamin with minerals  1 tablet Oral Daily   polyethylene glycol  17 g Oral Daily   potassium chloride  40 mEq Oral BID   revefenacin  175 mcg Nebulization Daily   sodium chloride flush  3 mL Intravenous Q12H    Infusions:  sodium chloride      PRN Medications: acetaminophen, haloperidol lactate, ipratropium-albuterol, ondansetron (ZOFRAN) IV, sodium chloride, tiZANidine   Assessment/Plan:   1. Shock --> Cardiogenic +/--septic  - CXR possible PNA. Finished azithromycin + ceftriaxone 5/22.  - Procalcitonin  0.51   - Echo ---> EF 20% possible Takotsubo CMP. BNP 2324 hs trop 585 - Cath 5/18 - Normal cors Filling pressures mildly elevated, CO 4.6, CI 2.2  - Hypotensive in the cath lab. Started on NE - Repeat limited echo 5/20: LV EF improved to 30%, no LV thrombus.  - Milrinone and NE now off.     - Volume appears stable off diuretics.  - Add back spiro 12.5 mg daily and losartan 12.5 mg daily - Continue digoxin 0.125.  - Continue Jardiance 10 mg daily. Watch for UTI.  2. Acute systolic HF due to NICM - cath 5/18 normal cors. 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  - Plan as above.   3 Acute Hypoxic Respiratory Failure multifactorial (HF/AECOPD) -Initially on Bipap --> Now on RA -Given steroids, antibiotics, nebs.  -severe underlying COPD (FEV1 24%)  4. Probable Apical Thrombus  -Continue heparin -Prior echo not definitive for apical thrombus, will order limited echo with contrast to define thrombus => unable to clearly exclude thrombus on 5/20 echo. -Given severe apical WMA have decided to [facility] with Eliquis for several weeks    5. DMII  - SSI - Continue Jardiance   6. Bipolar Disorder/Anxiety  - takes prn Xanax at home  7. H/O Seizure   - follow  8. ICU Delirium  - Resolved. - Treated with 3 days rocephin for possible UTI.   9. Leukocytosis - WBC 7>16>13>14.7 - Treated for possible PNA on admit + later completed abx  for possible  UTI - AF - Did receive steroids earlier in course    Disposition: -Needs aggressive PT/OT. HH PT/OT recommended at discharge -Anticipate  will be stable for discharge home in next couple of days if continues to improve  Length of Stay: 7   [facility], LINDSAY N PA-C  06/27/2021, 7:27 AM  Advanced Heart Failure Team Pager [phone] (M-F; 7a - 4p)  Please contact [facility] for night-coverage after hours (4p -7a ) and weekends on [email or web address]   Patient seen and examined with the above-signed Advanced Practice Provider and/or Housestaff. I personally reviewed laboratory data, imaging studies and relevant notes. I independently examined the patient and formulated the important aspects of the plan. I have edited the note to reflect any of my changes or salient points. I have personally discussed the plan with the patient and/or family.  Looks better today. No CP or SOB. Lass tachycardic. Respiratory status improved. Mood is calmer.   General:  Sitting up in ed No resp difficulty HEENT: normal Neck: supple. no JVD. Carotids 2+ bilat; no bruits. No lymphadenopathy or thryomegaly appreciated. Cor: PMI nondisplaced. Regular rate & rhythm. No rubs, gallops or murmurs. Lungs: clear Abdomen: soft, nontender, nondistended. No hepatosplenomegaly. No bruits or masses. Good bowel sounds. Extremities: no cyanosis, clubbing, rash, edema Neuro: alert & orientedx3, cranial nerves grossly intact. moves all 4 extremities w/o difficulty. Affect pleasant  Agree with plan as above to add back low-dose GDMT. She would not tolerate cMRI. Ok for d/c today from HF standpoint if she is otherwise stable.   Arvilla Meres, MD  8:51 AM  Addendum:  EF improved to 60-65% on echo today. Can stop digoxin and apixaban  Arvilla Meres, MD  5:08 PM

## 2021-06-27 NOTE — Plan of Care (Signed)
  Problem: Education: Goal: Ability to verbalize understanding of medication therapies will improve Outcome: Not Progressing   Problem: Education: Goal: Knowledge of General Education information will improve Description: Including pain rating scale, medication(s)/side effects and non-pharmacologic comfort measures Outcome: Not Progressing   Problem: Health Behavior/Discharge Planning: Goal: Ability to manage health-related needs will improve Outcome: Not Progressing   Problem: Nutrition: Goal: Adequate nutrition will be maintained Outcome: Not Progressing   Problem: Coping: Goal: Level of anxiety will decrease Outcome: Not Progressing   Problem: Education: Goal: Ability to demonstrate management of disease process will improve Outcome: Progressing Goal: Individualized Educational Video(s) Outcome: Progressing   Problem: Activity: Goal: Capacity to carry out activities will improve Outcome: Progressing   Problem: Cardiac: Goal: Ability to achieve and maintain adequate cardiopulmonary perfusion will improve Outcome: Progressing   Problem: Safety: Goal: Non-violent Restraint(s) Outcome: Progressing   Problem: Clinical Measurements: Goal: Ability to maintain clinical measurements within normal limits will improve Outcome: Progressing Goal: Will remain free from infection Outcome: Progressing Goal: Diagnostic test results will improve Outcome: Progressing Goal: Respiratory complications will improve Outcome: Progressing Goal: Cardiovascular complication will be avoided Outcome: Progressing   Problem: Activity: Goal: Risk for activity intolerance will decrease Outcome: Progressing   Problem: Elimination: Goal: Will not experience complications related to bowel motility Outcome: Progressing Goal: Will not experience complications related to urinary retention Outcome: Progressing   Problem: Pain Managment: Goal: General experience of comfort will  improve Outcome: Progressing   Problem: Safety: Goal: Ability to remain free from injury will improve Outcome: Progressing   Problem: Skin Integrity: Goal: Risk for impaired skin integrity will decrease Outcome: Progressing

## 2021-06-27 NOTE — Discharge Summary (Addendum)
Physician Discharge Summary      Patient ID: Karen Perry MRN: [id number] DOB/AGE: [birth year] 55 y.o.  Admit date: 06/20/2021 Discharge date: 06/27/2021  Discharge Diagnoses:   Acute hypoxemic respiratory failure Severe COPD FEV1 24% of predicted Acute decompensated HFrEF exacerbation EF 20% Concern for likely apical thrombus  ICU delirium Hx of Bipolar/anxiety  Hx of seizures DM2, currently controlled WBC count elevated   Discharge summary   56 yo F PMH COPD, anxiety, bipolar disorder, HTN, seizures presented to ED 5/18 with SOB and chest tightness. She was placed on Bipap in ED with concern for pulm edema, given steroids, rocephin and azithro for possible AECOPD.    ECHO revealed EF 20-25%-- with concern for possible ischemic disease, taken urgently for cath. Received lasix in ED.   In cath lab, pt on [place]. Developed worsening SOB during case.  PCCM consulted in this setting increased work of breathing.  By 5/20 Mentation wormseed with concern for developing. Peak confusion seen 5/23 for which Seroquel was started. Fortunately by 5/24 mentation had greatly improved and antipsychotic was stopped   Heart failure continued to follow and repeat ECHO 5/24 revealed slightly imporved EF of 30% with no LV thrombus. All pressors and inotrope's remain off   On 5/25 patient had returned to baseline mentation, worked with PT/OT and eating per baseline. All consulting services agreed on discharge home with home PT/OT   Discharge Plan by Active Problems   Acute hypoxemic respiratory failure -Iimproving, seems more driven by pulmonary edema Severe COPD FEV1 24% of predicted P: Respiratory status improved greatly with aggressive diuresing  Continue Spirolactone upon discharge  Continue BDs  Per Care Everywhere patient has a follow up apt with primary pulmonologist  Dr. Meredeth Ide 5/30, encouraged to keep apt Continue to encourage pulmonary hygiene upon discharge    Acute  decompensated HFrEF exacerbation -Echo on admit EF 20% possible Takotsubo  -Cath 5/18 - Normal cors Filling pressures mildly elevated, CO 4.6, CI 2.2  -Repeat limited echo 5/20: LV EF improved to 30%, no LV thrombus.  Concern for likely apical thrombus  -No LV thrombus seen on repeat ECHO 5/20, Repeat ECHO obtained 5/23 remains pending  P: Heart failure followed during admission and cleared patient for D/C 5/25 Follow up apt with HF 6/15 Continue Spironolactone, Losartan, Jardance and Digoxin upon discharge  Encouraged/educated on daily weight check to continue upon discharge  Continue Eliquis upon discharge    ICU delirium -Predisposed by underlying GAD+bipolar -Resolved by 5/24 Hx of Bipolar/anxiety  Hx of seizures P: Continue home Buspar and Lamictal upon discharge    DM2, currently controlled P: Continue Jardance upon discharge   Slightly leukocytosis  -Concern for possible UTI but patient denies any urinary symptoms and recived adequate ABT coverage during admit  P: Follow p with PCP for repeat blood work with a week  Significant Hospital tests/ studies  ECHO 5/20  No LV thrombus is seen with Definity contrast administration. Left ventricular ejection fraction, by estimation, is 30%. The left ventricle has severely decreased function. The left ventricle demonstrates regional wall motion abnormalities (see scoring diagram/findings for description). Indeterminate diastolic filling due to E-A fusion.  Procedures   None  Culture data/antimicrobials   MRSA Negative    Consults  Heart failure   Discharge Exam: BP 117/69   Pulse 88   Temp 98.7 F (37.1 C) (Oral)   Resp 12   Ht  (1.753 m)   Wt 74.5 kg   LMP 10/16/2014  SpO2 94%   BMI 24.25 kg/m   Physical Exam  General: Well appearing adult female lying in bed in NAD HEENT: [place]/AT, MM pink/moist, PERRL,  Neuro: Alert and oriented x3, non-focal  CV: s1s2 regular rate and rhythm, no murmur, rubs, or  gallops,  PULM:  Clear to ascultation, no increased work of breathing, on RA GI: soft, bowel sounds active in all 4 quadrants, non-tender, non-distended, tolerating oral diet Extremities: warm/dry, no edema  Skin: no rashes or lesions   Labs at discharge   Lab Results  Component Value Date   CREATININE 0.79 06/27/2021   BUN 25 (H) 06/27/2021   NA 138 06/27/2021   K 4.4 06/27/2021   CL 101 06/27/2021   CO2 25 06/27/2021   Lab Results  Component Value Date   WBC 14.7 (H) 06/27/2021   HGB 15.6 (H) 06/27/2021   HCT 48.8 (H) 06/27/2021   MCV 94.9 06/27/2021   PLT 152 06/27/2021   Lab Results  Component Value Date   ALT 32 06/26/2021   AST 25 06/26/2021   ALKPHOS 66 06/26/2021   BILITOT 1.2 06/26/2021   Lab Results  Component Value Date   INR 1.0 06/22/2021   INR 1.04 02/23/2009   INR 1.02 01/03/2009    Current radiological studies    ECHOCARDIOGRAM LIMITED  Result Date: 06/27/2021    ECHOCARDIOGRAM LIMITED REPORT   Patient Name:   Karen Perry Date of Exam: 06/25/2021 Medical Rec #:  [id number]               Height:       69.0 in Accession #:    [id number]              Weight:       156.7 lb Date of Birth:  12-09-1965                BSA:          1.863 m Patient Age:    56 years                BP:           113/84 mmHg Patient Gender: F                       HR:           103 bpm. Exam Location:  Inpatient Procedure: Limited Echo Indications:    Cardiomyopathy ; LVFX  History:        Patient has prior history of Echocardiogram examinations, most                 recent 06/22/2021. Risk Factors:Hypertension and Diabetes.  Sonographer:    Rodrigo Ran RCS Referring Phys: [id number] Vinnie Level SMITH IMPRESSIONS  1. Limited echo with few pictures  2. Left ventricular ejection fraction, by estimation, is 60 to 65%. Left ventricular ejection fraction by PLAX is 60 %. The left ventricle has normal function. The left ventricle has no regional wall motion abnormalities. There is  severe asymmetric left  ventricular hypertrophy of the basal-septal segment. IVS thickness is 2.0 cm, suggestive of infiltrative or possible hypertrophic cardiomyopathy.  3. Small RV cavity - appears underfilled. Right ventricular systolic function is hyperdynamic. The right ventricular size is normal.  4. The inferior vena cava IVC measures <1.2 cm and spontaneously collapses, suggestive of volume depletion and a low RA pressure of <3 mmHg. Comparison(s): Changes from prior study are noted. 06/22/2021: LVEF  30% with regional WMA's. Compared to the prior study a few days ago, the LVEF has improved and the wall motion abnormalities have resolved. There are signs of volume depletion on this study with a small, collapsable IVC and the RV is underfilled. Note, I was just notified today to read this study, however, it was apparently performed on 06/25/21. FINDINGS  Left Ventricle: Left ventricular ejection fraction, by estimation, is 60 to 65%. Left ventricular ejection fraction by PLAX is 60 %. The left ventricle has normal function. The left ventricle has no regional wall motion abnormalities. The left ventricular internal cavity size was small. There is severe asymmetric left ventricular hypertrophy of the basal-septal segment. Right Ventricle: Small RV cavity - appears underfilled. The right ventricular size is normal. No increase in right ventricular wall thickness. Right ventricular systolic function is hyperdynamic. Venous: The inferior vena cava IVC measures <1.2 cm and spontaneously collapses, suggestive of volume depletion and a low RA pressure of <3 mmHg. LEFT VENTRICLE PLAX 2D LV EF:         Left ventricular ejection fraction by PLAX is 60 %. LVIDd:         2.80 cm LVIDs:         1.95 cm LV PW:         1.35 cm LV IVS:        1.85 cm  LV Volumes (MOD) LV vol d, MOD A2C: 23.1 ml LV vol d, MOD A4C: 44.1 ml LV vol s, MOD A2C: 12.0 ml LV vol s, MOD A4C: 17.7 ml LV SV MOD A2C:     11.1 ml LV SV MOD A4C:     44.1 ml  LV SV MOD BP:      18.1 ml Zoila Shutter MD Electronically signed by Zoila Shutter MD Signature Date/Time: 06/27/2021/10:55:01 AM    Final     Disposition:  Home   Discharge disposition: 01-Home or Self Care       Discharge Instructions     Face-to-face encounter (required for Medicare/Medicaid patients)   Complete by: As directed    I Jasara Corrigan D. Harris certify that this patient is under my care and that I, or a nurse practitioner or physician's assistant working with me, had a face-to-face encounter that meets the physician face-to-face encounter requirements with this patient on 06/27/2021. The encounter with the patient was in whole, or in part for the following medical condition(s) which is the primary reason for home health care (List medical condition):   The encounter with the patient was in whole, or in part, for the following medical condition, which is the primary reason for home health care: Physical deconditioning   I certify that, based on my findings, the following services are medically necessary home health services:  Nursing Physical therapy     Reason for Medically Necessary Home Health Services: Therapy- Investment banker, operational, Patent examiner   My clinical findings support the need for the above services: Unable to leave home safely without assistance and/or assistive device   Further, I certify that my clinical findings support that this patient is homebound due to: Shortness of Breath with activity   Home Health   Complete by: As directed    To provide the following care/treatments:  PT RN         Allergies as of 06/27/2021       Reactions   Septra [sulfamethoxazole-trimethoprim] Hives        Medication List  STOP taking these medications    albuterol (2.5 MG/3ML) 0.083% nebulizer solution Commonly known as: PROVENTIL   metoprolol tartrate 25 MG tablet Commonly known as: LOPRESSOR       TAKE these medications    apixaban 5 MG  Tabs tablet Commonly known as: ELIQUIS Take 1 tablet (5 mg total) by mouth 2 (two) times daily.   atorvastatin 10 MG tablet Commonly known as: LIPITOR Take 10 mg by mouth daily.   busPIRone 10 MG tablet Commonly known as: BUSPAR Take 10 mg by mouth 2 (two) times daily.   digoxin 0.125 MG tablet Commonly known as: LANOXIN Take 1 tablet (0.125 mg total) by mouth daily. Start taking on: Jun 28, 2021   empagliflozin 10 MG Tabs tablet Commonly known as: JARDIANCE Take 1 tablet (10 mg total) by mouth daily. Start taking on: Jun 28, 2021   lamoTRIgine 25 MG tablet Commonly known as: LAMICTAL Take 25 mg by mouth daily.   losartan 25 MG tablet Commonly known as: COZAAR Take 0.5 tablets (12.5 mg total) by mouth daily. What changed: how much to take   montelukast 10 MG tablet Commonly known as: SINGULAIR Take 10 mg by mouth at bedtime.   spironolactone 25 MG tablet Commonly known as: ALDACTONE Take 0.5 tablets (12.5 mg total) by mouth daily. Start taking on: Jun 28, 2021   tiZANidine 2 MG tablet Commonly known as: ZANAFLEX Take 2 mg by mouth every 8 (eight) hours as needed.   Trelegy Ellipta 100-62.5-25 MCG/ACT Aepb Generic drug: Fluticasone-Umeclidin-Vilant Take 1 puff by mouth daily.         Follow-up appointment   HF 07/18/21 PCP 07/10/21 Pulmonary 07/02/21  Discharge Condition:    good  Signed: Niel Peretti D. Tiburcio Pea, NP-C [facility] Personal contact information can be found on Amion  06/27/2021, 12:15 PM

## 2021-07-02 DIAGNOSIS — F319 Bipolar disorder, unspecified: Secondary | ICD-10-CM | POA: Diagnosis not present

## 2021-07-02 DIAGNOSIS — Z87891 Personal history of nicotine dependence: Secondary | ICD-10-CM | POA: Diagnosis not present

## 2021-07-02 DIAGNOSIS — I5021 Acute systolic (congestive) heart failure: Secondary | ICD-10-CM | POA: Diagnosis not present

## 2021-07-02 DIAGNOSIS — I110 Hypertensive heart disease with heart failure: Secondary | ICD-10-CM | POA: Diagnosis not present

## 2021-07-02 DIAGNOSIS — E119 Type 2 diabetes mellitus without complications: Secondary | ICD-10-CM | POA: Diagnosis not present

## 2021-07-02 DIAGNOSIS — I700 Atherosclerosis of aorta: Secondary | ICD-10-CM | POA: Diagnosis not present

## 2021-07-02 DIAGNOSIS — E118 Type 2 diabetes mellitus with unspecified complications: Secondary | ICD-10-CM | POA: Diagnosis not present

## 2021-07-02 DIAGNOSIS — J449 Chronic obstructive pulmonary disease, unspecified: Secondary | ICD-10-CM | POA: Diagnosis not present

## 2021-07-02 DIAGNOSIS — I509 Heart failure, unspecified: Secondary | ICD-10-CM | POA: Diagnosis not present

## 2021-07-03 ENCOUNTER — Other Ambulatory Visit
Admission: RE | Admit: 2021-07-03 | Discharge: 2021-07-03 | Disposition: A | Discharge status: Home / Self Care | Payer: Medicare HMO | Source: Ambulatory Visit | Attending: Specialist | Admitting: Specialist

## 2021-07-03 ENCOUNTER — Telehealth: Payer: Self-pay

## 2021-07-03 DIAGNOSIS — R0789 Other chest pain: Secondary | ICD-10-CM | POA: Diagnosis not present

## 2021-07-03 DIAGNOSIS — I429 Cardiomyopathy, unspecified: Secondary | ICD-10-CM | POA: Diagnosis not present

## 2021-07-03 DIAGNOSIS — J449 Chronic obstructive pulmonary disease, unspecified: Secondary | ICD-10-CM | POA: Diagnosis not present

## 2021-07-03 DIAGNOSIS — R5382 Chronic fatigue, unspecified: Secondary | ICD-10-CM | POA: Diagnosis not present

## 2021-07-03 DIAGNOSIS — R946 Abnormal results of thyroid function studies: Secondary | ICD-10-CM | POA: Diagnosis not present

## 2021-07-03 DIAGNOSIS — I509 Heart failure, unspecified: Secondary | ICD-10-CM | POA: Diagnosis not present

## 2021-07-03 LAB — D-DIMER, QUANTITATIVE: D-Dimer, Quant: 1.38 ug/mL-FEU — ABNORMAL HIGH (ref 0.00–0.50)

## 2021-07-03 NOTE — Telephone Encounter (Signed)
NOTES SCANNED TO REFERRAL 

## 2021-07-04 ENCOUNTER — Other Ambulatory Visit: Payer: Self-pay | Admitting: Specialist

## 2021-07-04 ENCOUNTER — Ambulatory Visit: Payer: Medicare HMO

## 2021-07-04 ENCOUNTER — Ambulatory Visit
Admission: RE | Admit: 2021-07-04 | Discharge: 2021-07-04 | Disposition: A | Discharge status: Home / Self Care | Payer: Medicare HMO | Source: Ambulatory Visit | Attending: Specialist | Admitting: Specialist

## 2021-07-04 ENCOUNTER — Encounter (HOSPITAL_COMMUNITY): Payer: Self-pay

## 2021-07-04 DIAGNOSIS — R0789 Other chest pain: Secondary | ICD-10-CM

## 2021-07-04 DIAGNOSIS — J432 Centrilobular emphysema: Secondary | ICD-10-CM | POA: Diagnosis not present

## 2021-07-04 DIAGNOSIS — R7989 Other specified abnormal findings of blood chemistry: Secondary | ICD-10-CM

## 2021-07-04 DIAGNOSIS — S2220XA Unspecified fracture of sternum, initial encounter for closed fracture: Secondary | ICD-10-CM | POA: Diagnosis not present

## 2021-07-04 DIAGNOSIS — K449 Diaphragmatic hernia without obstruction or gangrene: Secondary | ICD-10-CM | POA: Diagnosis not present

## 2021-07-04 DIAGNOSIS — I2510 Atherosclerotic heart disease of native coronary artery without angina pectoris: Secondary | ICD-10-CM | POA: Diagnosis not present

## 2021-07-04 MED ORDER — IOPAMIDOL (ISOVUE-370) INJECTION 76%
75.0000 mL | Freq: Once | INTRAVENOUS | Status: AC | PRN
  Administered 2021-07-04: 75 mL via INTRAVENOUS

## 2021-07-10 ENCOUNTER — Encounter (HOSPITAL_COMMUNITY): Payer: Medicare HMO

## 2021-07-10 DIAGNOSIS — E119 Type 2 diabetes mellitus without complications: Secondary | ICD-10-CM | POA: Diagnosis not present

## 2021-07-10 DIAGNOSIS — F319 Bipolar disorder, unspecified: Secondary | ICD-10-CM | POA: Diagnosis not present

## 2021-07-10 DIAGNOSIS — J4540 Moderate persistent asthma, uncomplicated: Secondary | ICD-10-CM | POA: Diagnosis not present

## 2021-07-10 DIAGNOSIS — I5043 Acute on chronic combined systolic (congestive) and diastolic (congestive) heart failure: Secondary | ICD-10-CM | POA: Diagnosis not present

## 2021-07-10 DIAGNOSIS — J9601 Acute respiratory failure with hypoxia: Secondary | ICD-10-CM | POA: Diagnosis not present

## 2021-07-10 DIAGNOSIS — I110 Hypertensive heart disease with heart failure: Secondary | ICD-10-CM | POA: Diagnosis not present

## 2021-07-10 DIAGNOSIS — J449 Chronic obstructive pulmonary disease, unspecified: Secondary | ICD-10-CM | POA: Diagnosis not present

## 2021-07-10 DIAGNOSIS — F411 Generalized anxiety disorder: Secondary | ICD-10-CM | POA: Diagnosis not present

## 2021-07-17 ENCOUNTER — Telehealth (HOSPITAL_COMMUNITY): Payer: Self-pay

## 2021-07-17 ENCOUNTER — Other Ambulatory Visit (HOSPITAL_COMMUNITY): Payer: Self-pay

## 2021-07-17 NOTE — Telephone Encounter (Signed)
Pharmacy Transitions of Care Follow-up Telephone Call  Date of discharge: 06/27/21  Discharge Diagnosis: Acute respiratory failure  How have you been since you were released from the hospital? Patient has been doing well, still short of breath, but getting better. She has not been taking the Jardiance, because she thought it was for diabetes, but I educated her on the use in CHF. She is going to the MD tomorrow and will ask more questions if needed.   Medication changes made at discharge: START taking these medications  START taking these medications  Jardiance 10 MG Tabs tablet Generic drug: empagliflozin Take 1 tablet (10 mg total) by mouth daily.  spironolactone 25 MG tablet Commonly known as: ALDACTONE Take 1/2 tablet (12.5 mg total) by mouth daily.   CHANGE how you take these medications  CHANGE how you take these medications  losartan 25 MG tablet Commonly known as: COZAAR Take 1/2 tablet (12.5 mg total) by mouth daily. What changed: how much to take   CONTINUE taking these medications  CONTINUE taking these medications  atorvastatin 10 MG tablet Commonly known as: LIPITOR  busPIRone 10 MG tablet Commonly known as: BUSPAR  lamoTRIgine 25 MG tablet Commonly known as: LAMICTAL  montelukast 10 MG tablet Commonly known as: SINGULAIR  tiZANidine 2 MG tablet Commonly known as: ZANAFLEX  Trelegy Ellipta 100-62.5-25 MCG/ACT Aepb Generic drug: Fluticasone-Umeclidin-Vilant   STOP taking these medications  STOP taking these medications  albuterol (2.5 MG/3ML) 0.083% nebulizer solution Commonly known as: PROVENTIL  metoprolol tartrate 25 MG tablet Commonly known as: LOPRESSOR    Medication changes verified by the patient? Yes    Medication Accessibility:  Home Pharmacy: [facility]   Was the patient provided with refills on discharged medications? no   Have all prescriptions been transferred from [facility] to home pharmacy? MD has reordered   Is the patient able to  afford medications? insured Notable copays: 0 Eligible patient assistance: no    Medication Review: APIXABAN (ELIQUIS) - Patient did not get- stopped before discharge Apixaban 5 mg BID - Discussed importance of taking medication around the same time everyday  - Reviewed potential DDIs with patient  - Advised patient of medications to avoid (NSAIDs, ASA)  - Educated that Tylenol (acetaminophen) will be the preferred analgesic to prevent risk of bleeding  - Emphasized importance of monitoring for signs and symptoms of bleeding (abnormal bruising, prolonged bleeding, nose bleeds, bleeding from gums, discolored urine, black tarry stools)  - Advised patient to alert all providers of anticoagulation therapy prior to starting a new medication or having a procedure   Follow-up Appointments: Date Visit Type Length Department    07/18/2021  9:30 AM EST POST HOSP 30 min [facility]     If their condition worsens, is the pt aware to call PCP or go to the Emergency Dept.? yes  Final Patient Assessment: Patient has been doing well, still short of breath, but getting better. She has not been taking the Jardiance, because she thought it was for diabetes, but I educated her on the use in CHF. She is going to the MD tomorrow and will ask more questions if needed.   Jiles Crocker, PharmD Clinical Pharmacist Med [facility] 07/17/2021 2:09 PM

## 2021-07-17 NOTE — Telephone Encounter (Signed)
Transitions of Care Pharmacy   Call attempted for a pharmacy transitions of care follow-up.  Call attempt #1. Will follow-up in 2-3 days.   Jiles Crocker, PharmD Clinical Pharmacist Med [facility] 07/17/2021 1:14 PM

## 2021-07-17 NOTE — Progress Notes (Addendum)
PCP: Dr Caryl Comes with [facility] Primary HF Cardiologist: Dr Haroldine Laws  Pulmonary: Dr Raul Del   HPI:  Karen Perry is a 56 year old with COPD, asthma, htn, anxiety, bipolar disorder, and seizures.   Presented to ED 06/20/21 with increased shortness of breath and chest tightness. Treated for PNA. Cath 5/18 - Normal cors Filling pressures mildly elevated, CO 4.6, CI 2.2    Echo  EF 20-25% RV normal, Possible apical thrombus, possible Taktsubo.   Limited ECHO repeat 06/27/21 and EF improved 60-65%. Eliquis and dig stopped prior to d/c .  Saw Dr Estill [facility] on 07/02/21. He stopped jardiance and buspar stopped. Renal function stable.   Today she returns for HF follow up with her husband. Overall feeling ok. Says she gets anxious going to MD appointments. Mild SOB with exeriton. Resolves with inhaler. Denies PND/Orthopnea. Heart rate at home has been in the 80s. SBP 100-110.  Appetite ok. No fever or chills. Weight at home stable. Taking all medications but she has not been taking losartan due to lower blood pressure at home.  PCP stopped jardiance. Followed by PT.   Cardiac Testing Echo  06/20/21: EF 20-25% RV normal, Possible apical thrombus, possible [place]usbo. Limited echo with contrast (5/20): Some improvement in EF to 30%, no LV thrombus.  Limited ECHO repeat 06/27/21 and EF improved 60-65%.  Cath 5/18 - Normal cors Filling pressures mildly elevated, CO 4.6, CI 2.2     ROS: All systems negative except as listed in HPI, PMH and Problem List.  SH:  Social History   Socioeconomic History   Marital status: Married    Spouse name: Not on file   Number of children: Not on file   Years of education: Not on file   Highest education level: Not on file  Occupational History   Not on file  Tobacco Use   Smoking status: Every Day    Packs/day: 0.50    Types: Cigarettes   Smokeless tobacco: Not on file  Substance and Sexual Activity   Alcohol use: No   Drug use: Not on file    Sexual activity: Not on file  Other Topics Concern   Not on file  Social History Narrative   Not on file   Social Determinants of Health   Financial Resource Strain: Not on file  Food Insecurity: Not on file  Transportation Needs: Not on file  Physical Activity: Not on file  Stress: Not on file  Social Connections: Not on file  Intimate Partner Violence: Not on file    FH:  Family History  Adopted: Yes    Past Medical History:  Diagnosis Date   Asthma    Bipolar 1 disorder ([place])    Diabetes mellitus without complication ([place])    Hypertension    Seizures ([place])     Current Outpatient Medications  Medication Sig Dispense Refill   atorvastatin (LIPITOR) 10 MG tablet Take 10 mg by mouth daily.     lamoTRIgine (LAMICTAL) 25 MG tablet Take 25 mg by mouth daily.     montelukast (SINGULAIR) 10 MG tablet Take 10 mg by mouth at bedtime.     QUEtiapine (SEROQUEL) 25 MG tablet Take 12.5 mg by mouth at bedtime.     spironolactone (ALDACTONE) 25 MG tablet Take 1/2 tablet (12.5 mg total) by mouth daily. 30 tablet 0   TRELEGY ELLIPTA 100-62.5-25 MCG/ACT AEPB Take 1 puff by mouth daily.     losartan (COZAAR) 25 MG tablet Take 1/2 tablet (12.5  mg total) by mouth daily. (Patient not taking: Reported on 07/18/2021) 30 tablet 0   No current facility-administered medications for this encounter.    Vitals:   07/18/21 0931  BP: 106/74  Pulse: (!) 109  SpO2: 97%  Weight: 69.8 kg (153 lb 12.8 oz)   Wt Readings from Last 3 Encounters:  07/18/21 69.8 kg (153 lb 12.8 oz)  06/27/21 74.5 kg (164 lb 3.9 oz)  12/13/14 93.9 kg (207 lb)    PHYSICAL EXAM:  General:  Walked in the clinic. No resp difficulty HEENT: normal Neck: supple. JVP flat. Carotids 2+ bilaterally; no bruits. No lymphadenopathy or thryomegaly appreciated. Cor: PMI normal. Tachy Regular rate & rhythm. No rubs, gallops or murmurs. Lungs: clear Abdomen: soft, nontender, nondistended. No hepatosplenomegaly. No bruits or  masses. Good bowel sounds. Extremities: no cyanosis, clubbing, rash, edema Neuro: alert & orientedx3, cranial nerves grossly intact. Moves all 4 extremities w/o difficulty. Affect pleasant.   ECG: Sinsu Tach 109 bpm   ASSESSMENT & PLAN: 1. HFiEF Echo 06/20/21 ---> EF 20% possible Takotsubo CMP--->Echo 5/25 EF improved to 60-65% .  Cath 5/18 - Normal cors Filling pressures mildly elevated, CO 4.6, CI 2.2  PCP stopped losartan and jardiance.  For now continue spiro.  Repeat ECHO in 2 months with Dr Haroldine Laws. Discussed if EF drops will need to restart GDMT.  I reviewed BMET from PCP 07/02/21.   2. Sinus Tach  -Suspect r/t anxiety. Heart rate at home in 80s.   3. H/O Seizure -Per PCP  4. COPD FEV1 24% Sats stable on room air.  Followed by Pulmonary    Herchel Hopkin NP-C  10:27 AM

## 2021-07-18 ENCOUNTER — Encounter (HOSPITAL_COMMUNITY): Payer: Self-pay

## 2021-07-18 ENCOUNTER — Ambulatory Visit (HOSPITAL_COMMUNITY)
Admit: 2021-07-18 | Discharge: 2021-07-18 | Disposition: A | Discharge status: Home / Self Care | Payer: Medicare HMO | Attending: Adult Health | Admitting: Adult Health

## 2021-07-18 VITALS — BP 106/74 | HR 109 | Wt 153.8 lb

## 2021-07-18 DIAGNOSIS — I5022 Chronic systolic (congestive) heart failure: Secondary | ICD-10-CM

## 2021-07-18 DIAGNOSIS — R002 Palpitations: Secondary | ICD-10-CM | POA: Insufficient documentation

## 2021-07-18 DIAGNOSIS — J449 Chronic obstructive pulmonary disease, unspecified: Secondary | ICD-10-CM | POA: Diagnosis not present

## 2021-07-18 DIAGNOSIS — I110 Hypertensive heart disease with heart failure: Secondary | ICD-10-CM | POA: Diagnosis not present

## 2021-07-18 DIAGNOSIS — R0789 Other chest pain: Secondary | ICD-10-CM | POA: Diagnosis not present

## 2021-07-18 DIAGNOSIS — R000 Tachycardia, unspecified: Secondary | ICD-10-CM | POA: Insufficient documentation

## 2021-07-18 DIAGNOSIS — F319 Bipolar disorder, unspecified: Secondary | ICD-10-CM | POA: Insufficient documentation

## 2021-07-18 DIAGNOSIS — F419 Anxiety disorder, unspecified: Secondary | ICD-10-CM | POA: Diagnosis not present

## 2021-07-18 DIAGNOSIS — Z91148 Patient's other noncompliance with medication regimen for other reason: Secondary | ICD-10-CM | POA: Diagnosis not present

## 2021-07-18 DIAGNOSIS — R569 Unspecified convulsions: Secondary | ICD-10-CM | POA: Insufficient documentation

## 2021-07-18 DIAGNOSIS — Z79899 Other long term (current) drug therapy: Secondary | ICD-10-CM | POA: Insufficient documentation

## 2021-07-18 NOTE — Patient Instructions (Addendum)
EKG done today.  No Labs done today.   No medication changes were made. Please continue all current medications as prescribed.  Your physician recommends that you schedule a follow-up appointment in: 2 months with an echo prior to your exam.  Your physician has requested that you have an echocardiogram. Echocardiography is a painless test that uses sound waves to create images of your heart. It provides your doctor with information about the size and shape of your heart and how well your heart's chambers and valves are working. This procedure takes approximately one hour. There are no restrictions for this procedure.  If you have any questions or concerns before your next appointment please send [place] a message through [place] or call our office at [phone].    TO LEAVE A MESSAGE FOR THE NURSE SELECT OPTION 2, PLEASE LEAVE A MESSAGE INCLUDING: YOUR NAME DATE OF BIRTH CALL BACK NUMBER REASON FOR CALL**this is important as we prioritize the call backs  YOU WILL RECEIVE A CALL BACK THE SAME DAY AS LONG AS YOU CALL BEFORE 4:00 PM   Do the following things EVERYDAY: Weigh yourself in the morning before breakfast. Write it down and keep it in a log. Take your medicines as prescribed Eat low salt foods--Limit salt (sodium) to 2000 mg per day.  Stay as active as you can everyday Limit all fluids for the day to less than 2 liters   At the [facility], you and your health needs are our priority. As part of our continuing mission to provide you with exceptional heart care, we have created designated Provider Care Teams. These Care Teams include your primary Cardiologist (physician) and Advanced Practice Providers (APPs- Physician Assistants and Nurse Practitioners) who all work together to provide you with the care you need, when you need it.   You may see any of the following providers on your designated Care Team at your next follow up: Dr Arvilla Meres Dr Carron Curie, NP Robbie Lis, [place] Karle Plumber, PharmD   Please be sure to bring in all your medications bottles to every appointment.

## 2021-07-30 DIAGNOSIS — I110 Hypertensive heart disease with heart failure: Secondary | ICD-10-CM | POA: Diagnosis not present

## 2021-07-30 DIAGNOSIS — I700 Atherosclerosis of aorta: Secondary | ICD-10-CM | POA: Diagnosis not present

## 2021-07-30 DIAGNOSIS — F319 Bipolar disorder, unspecified: Secondary | ICD-10-CM | POA: Diagnosis not present

## 2021-07-30 DIAGNOSIS — R000 Tachycardia, unspecified: Secondary | ICD-10-CM | POA: Diagnosis not present

## 2021-07-30 DIAGNOSIS — J449 Chronic obstructive pulmonary disease, unspecified: Secondary | ICD-10-CM | POA: Diagnosis not present

## 2021-07-30 DIAGNOSIS — I509 Heart failure, unspecified: Secondary | ICD-10-CM | POA: Diagnosis not present

## 2021-07-30 DIAGNOSIS — E119 Type 2 diabetes mellitus without complications: Secondary | ICD-10-CM | POA: Diagnosis not present

## 2021-08-20 DIAGNOSIS — R000 Tachycardia, unspecified: Secondary | ICD-10-CM | POA: Diagnosis not present

## 2021-08-22 DIAGNOSIS — D582 Other hemoglobinopathies: Secondary | ICD-10-CM | POA: Diagnosis not present

## 2021-08-22 DIAGNOSIS — E118 Type 2 diabetes mellitus with unspecified complications: Secondary | ICD-10-CM | POA: Diagnosis not present

## 2021-08-29 DIAGNOSIS — I700 Atherosclerosis of aorta: Secondary | ICD-10-CM | POA: Diagnosis not present

## 2021-08-29 DIAGNOSIS — Z1389 Encounter for screening for other disorder: Secondary | ICD-10-CM | POA: Diagnosis not present

## 2021-08-29 DIAGNOSIS — Z8679 Personal history of other diseases of the circulatory system: Secondary | ICD-10-CM | POA: Diagnosis not present

## 2021-08-29 DIAGNOSIS — E119 Type 2 diabetes mellitus without complications: Secondary | ICD-10-CM | POA: Diagnosis not present

## 2021-08-29 DIAGNOSIS — J449 Chronic obstructive pulmonary disease, unspecified: Secondary | ICD-10-CM | POA: Diagnosis not present

## 2021-08-29 DIAGNOSIS — I10 Essential (primary) hypertension: Secondary | ICD-10-CM | POA: Diagnosis not present

## 2021-08-29 DIAGNOSIS — F319 Bipolar disorder, unspecified: Secondary | ICD-10-CM | POA: Diagnosis not present

## 2021-08-29 DIAGNOSIS — D582 Other hemoglobinopathies: Secondary | ICD-10-CM | POA: Diagnosis not present

## 2021-08-29 DIAGNOSIS — I471 Supraventricular tachycardia: Secondary | ICD-10-CM | POA: Diagnosis not present

## 2021-08-29 DIAGNOSIS — Z0000 Encounter for general adult medical examination without abnormal findings: Secondary | ICD-10-CM | POA: Diagnosis not present

## 2021-09-05 NOTE — Progress Notes (Deleted)
Psychiatric Initial Adult Assessment   Patient Identification: Karen Perry MRN:  [id number] Date of Evaluation:  09/05/2021 Referral Source: *** Chief Complaint:  No chief complaint on file.  Visit Diagnosis: No diagnosis found.  History of Present Illness:   Karen Perry is a 56 y.o. 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, diabetes, COPD,  asthma, who is referred for bipolar disorder.          Associated Signs/Symptoms: Depression Symptoms:  {DEPRESSION SYMPTOMS:20000} (Hypo) Manic Symptoms:  {BHH MANIC SYMPTOMS:22872} Anxiety Symptoms:  {BHH ANXIETY SYMPTOMS:22873} Psychotic Symptoms:  {BHH PSYCHOTIC SYMPTOMS:22874} PTSD Symptoms: {BHH PTSD SYMPTOMS:22875}  Past Psychiatric History:  Outpatient:  Psychiatry admission:  Previous suicide attempt:  Past trials of medication:  History of violence:    Previous Psychotropic Medications: {YES/NO:21197}  Substance Abuse History in the last 12 months:  {yes no:314532}  Consequences of Substance Abuse: {BHH CONSEQUENCES OF SUBSTANCE ABUSE:22880}  Past Medical History:  Past Medical History:  Diagnosis Date   Asthma    Bipolar 1 disorder (HCC)    Diabetes mellitus without complication (HCC)    Hypertension    Seizures (HCC)     Past Surgical History:  Procedure Laterality Date   ARTERIAL LINE INSERTION N/A 06/20/2021   Procedure: ARTERIAL LINE INSERTION;  Surgeon: Dolores Patty, MD;  Location: [facility];  Service: Cardiovascular;  Laterality: N/A;   BACK SURGERY     RIGHT/LEFT HEART CATH AND CORONARY ANGIOGRAPHY N/A 06/20/2021   Procedure: RIGHT/LEFT HEART CATH AND CORONARY ANGIOGRAPHY;  Surgeon: Dolores Patty, MD;  Location: [facility];  Service: Cardiovascular;  Laterality: N/A;    Family Psychiatric History: ***  Family History:  Family History  Adopted: Yes    Social History:   Social History    Socioeconomic History   Marital status: Married    Spouse name: Not on file   Number of children: Not on file   Years of education: Not on file   Highest education level: Not on file  Occupational History   Not on file  Tobacco Use   Smoking status: Every Day    Packs/day: 0.50    Types: Cigarettes   Smokeless tobacco: Not on file  Substance and Sexual Activity   Alcohol use: No   Drug use: Not on file   Sexual activity: Not on file  Other Topics Concern   Not on file  Social History Narrative   Not on file   Social Determinants of Health   Financial Resource Strain: Not on file  Food Insecurity: Not on file  Transportation Needs: Not on file  Physical Activity: Not on file  Stress: Not on file  Social Connections: Not on file    Additional Social History: ***  Allergies:   Allergies  Allergen Reactions   Septra [Sulfamethoxazole-Trimethoprim] Hives    Metabolic Disorder Labs: Lab Results  Component Value Date   HGBA1C 5.6 06/21/2021   MPG 114.02 06/21/2021   No results found for: "PROLACTIN" Lab Results  Component Value Date   CHOL 218 (H) 06/20/2021   TRIG 142 06/20/2021   HDL 55 06/20/2021   CHOLHDL 4.0 06/20/2021   VLDL 28 06/20/2021   LDLCALC 135 (H) 06/20/2021   LDLCALC (H) 02/25/2009    116        Total Cholesterol/HDL:CHD Risk Coronary Heart Disease Risk Table  Men   Women  1/2 Average Risk   3.4   3.3  Average Risk       5.0   4.4  2 X Average Risk   9.6   7.1  3 X Average Risk  23.4   11.0        Use the calculated Patient Ratio above and the CHD Risk Table to determine the patient's CHD Risk.        ATP III CLASSIFICATION (LDL):  <100     mg/dL   Optimal  735-329  mg/dL   Near or Above                    Optimal  130-159  mg/dL   Borderline  924-268  mg/dL   High  >341     mg/dL   Very High   Lab Results  Component Value Date   TSH 0.318 (L) 06/20/2021    Therapeutic Level Labs: No results found for:  "LITHIUM" No results found for: "CBMZ" No results found for: "VALPROATE"  Current Medications: Current Outpatient Medications  Medication Sig Dispense Refill   atorvastatin (LIPITOR) 10 MG tablet Take 10 mg by mouth daily.     lamoTRIgine (LAMICTAL) 25 MG tablet Take 25 mg by mouth daily.     losartan (COZAAR) 25 MG tablet Take 1/2 tablet (12.5 mg total) by mouth daily. (Patient not taking: Reported on 07/18/2021) 30 tablet 0   montelukast (SINGULAIR) 10 MG tablet Take 10 mg by mouth at bedtime.     QUEtiapine (SEROQUEL) 25 MG tablet Take 12.5 mg by mouth at bedtime.     spironolactone (ALDACTONE) 25 MG tablet Take 1/2 tablet (12.5 mg total) by mouth daily. 30 tablet 0   TRELEGY ELLIPTA 100-62.5-25 MCG/ACT AEPB Take 1 puff by mouth daily.     No current facility-administered medications for this visit.    Musculoskeletal: Strength & Muscle Tone: within normal limits Gait & Station: normal Patient leans: N/A  Psychiatric Specialty Exam: Review of Systems  Last menstrual period 10/16/2014.There is no height or weight on file to calculate BMI.  General Appearance: {Appearance:22683}  Eye Contact:  {BHH EYE CONTACT:22684}  Speech:  Clear and Coherent  Volume:  Normal  Mood:  {BHH MOOD:22306}  Affect:  {Affect (PAA):22687}  Thought Process:  Coherent  Orientation:  Full (Time, Place, and Person)  Thought Content:  Logical  Suicidal Thoughts:  {ST/HT (PAA):22692}  Homicidal Thoughts:  {ST/HT (PAA):22692}  Memory:  Immediate;   Good  Judgement:  {Judgement (PAA):22694}  Insight:  {Insight (PAA):22695}  Psychomotor Activity:  Normal  Concentration:  Concentration: Good and Attention Span: Good  Recall:  Good  Fund of Knowledge:Good  Language: Good  Akathisia:  No  Handed:  Right  AIMS (if indicated):  not done  Assets:  Communication Skills Desire for Improvement  ADL's:  Intact  Cognition: WNL  Sleep:  {BHH GOOD/FAIR/POOR:22877}   Screenings: Flowsheet Row ED to  Hosp-Admission (Discharged) from 06/20/2021 in [facility] PRO[facility]ESS[facility]ARE  C-SSRS RISK CATEGORY No Risk       Assessment and Plan:  Assessment  Plan   The patient demonstrates the following risk factors for suicide: Chronic risk factors for suicide include: {Chronic Risk Factors for DQQIWLN:98921194}. Acute risk factors for suicide include: {Acute Risk Factors for RDEYCXK:48185631}. Protective factors for this patient include: {Protective Factors for Suicide SHFW:26378588}. Considering these factors, the overall suicide risk at this point appears to be {Desc; low/moderate/high:110033}. Patient {  ACTION; IS/IS GI:087931 appropriate for outpatient follow up.   Collaboration of Care: {BH OP Collaboration of Care:21014065}  Patient/Guardian was advised Release of Information must be obtained prior to any record release in order to collaborate their care with an outside provider. Patient/Guardian was advised if they have not already done so to contact the registration department to sign all necessary forms in order for [place] to release information regarding their care.   Consent: Patient/Guardian gives verbal consent for treatment and assignment of benefits for services provided during this visit. Patient/Guardian expressed understanding and agreed to proceed.   Norman Clay, MD 8/3/20235:36 PM

## 2021-09-10 ENCOUNTER — Ambulatory Visit: Payer: Self-pay | Admitting: Psychiatry

## 2021-09-18 ENCOUNTER — Ambulatory Visit (HOSPITAL_COMMUNITY)
Admission: RE | Admit: 2021-09-18 | Discharge: 2021-09-18 | Disposition: A | Discharge status: Home / Self Care | Payer: Medicare HMO | Source: Ambulatory Visit | Attending: Internal Medicine | Admitting: Internal Medicine

## 2021-09-18 ENCOUNTER — Encounter (HOSPITAL_COMMUNITY): Payer: Self-pay | Admitting: Internal Medicine

## 2021-09-18 ENCOUNTER — Ambulatory Visit (HOSPITAL_BASED_OUTPATIENT_CLINIC_OR_DEPARTMENT_OTHER)
Admission: RE | Admit: 2021-09-18 | Discharge: 2021-09-18 | Disposition: A | Discharge status: Home / Self Care | Payer: Medicare HMO | Source: Ambulatory Visit | Attending: Internal Medicine | Admitting: Internal Medicine

## 2021-09-18 VITALS — BP 110/70 | HR 98 | Wt 154.4 lb

## 2021-09-18 DIAGNOSIS — I5022 Chronic systolic (congestive) heart failure: Secondary | ICD-10-CM

## 2021-09-18 DIAGNOSIS — Z79899 Other long term (current) drug therapy: Secondary | ICD-10-CM | POA: Diagnosis not present

## 2021-09-18 DIAGNOSIS — I110 Hypertensive heart disease with heart failure: Secondary | ICD-10-CM | POA: Insufficient documentation

## 2021-09-18 DIAGNOSIS — R1012 Left upper quadrant pain: Secondary | ICD-10-CM | POA: Diagnosis not present

## 2021-09-18 DIAGNOSIS — J449 Chronic obstructive pulmonary disease, unspecified: Secondary | ICD-10-CM | POA: Diagnosis not present

## 2021-09-18 DIAGNOSIS — F319 Bipolar disorder, unspecified: Secondary | ICD-10-CM | POA: Diagnosis not present

## 2021-09-18 DIAGNOSIS — I5181 Takotsubo syndrome: Secondary | ICD-10-CM | POA: Diagnosis not present

## 2021-09-18 DIAGNOSIS — R002 Palpitations: Secondary | ICD-10-CM

## 2021-09-18 LAB — ECHOCARDIOGRAM COMPLETE
Area-P 1/2: 3.5 cm2
S' Lateral: 3 cm

## 2021-09-18 MED ORDER — PERFLUTREN LIPID MICROSPHERE
1.0000 mL | INTRAVENOUS | Status: DC | PRN
  Administered 2021-09-18: 1.5 mL via INTRAVENOUS

## 2021-09-18 NOTE — Progress Notes (Signed)
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    Presented to ED 06/20/21 with increased shortness of breath and chest tightness. Treated for PNA. Cath 5/18 - Normal cors.  Filling pressures mildly elevated, CO 4.6, CI 2.2    Echo  EF 20-25% RV normal, Possible apical thrombus, possible Taktsubo.   Limited ECHO repeat 06/27/21 and EF improved 60-65%. Eliquis and dig stopped prior to d/c .  Saw Dr Orlie Dakin [facility] on 07/02/21. He stopped jardiance and buspar stopped. Renal function stable.   PCP stopped losartan and jardiance.   PCP started lopressor a couple weeks ago for tachycardia.    Today she returns for HF follow up with her husband. Overall feeling fine. Feeling much stronger. Not walking much due to the  heart and humidity. Denies SOB/PND/Orthopnea. Having intermittent LUQ pain.Says it comes and goes. Appetite ok. No fever or chills. Weight at home 154-155 pounds. Taking all medications.   Cardiac Testing Echo  06/20/21: EF 20-25% RV normal, Possible apical thrombus, possible Taktusbo. Limited echo with contrast (5/20): Some improvement in EF to 30%, no LV thrombus.  Limited ECHO repeat 06/27/21 and EF improved 60-65%.  Cath 5/18 - Normal cors Filling pressures mildly elevated, CO 4.6, CI 2.2     ROS: All systems negative except as listed in HPI, PMH and Problem List.  SH:  Social History   Socioeconomic History   Marital status: Married    Spouse name: Not on file   Number of children: Not on file   Years of education: Not on file   Highest education level: Not on file  Occupational History   Not on file  Tobacco Use   Smoking status: Every Day    Packs/day: 0.50    Types: Cigarettes   Smokeless tobacco: Not on file  Substance and Sexual Activity   Alcohol use: No   Drug use: Not on  file   Sexual activity: Not on file  Other Topics Concern   Not on file  Social History Narrative   Not on file   Social Determinants of Health   Financial Resource Strain: Not on file  Food Insecurity: Not on file  Transportation Needs: Not on file  Physical Activity: Not on file  Stress: Not on file  Social Connections: Not on file  Intimate Partner Violence: Not on file    FH:  Family History  Adopted: Yes    Past Medical History:  Diagnosis Date   Asthma    Bipolar 1 disorder (HCC)    Diabetes mellitus without complication (HCC)    Hypertension    Seizures (HCC)     Current Outpatient Medications  Medication Sig Dispense Refill   atorvastatin (LIPITOR) 10 MG tablet Take 10 mg by mouth daily.     lamoTRIgine (LAMICTAL) 25 MG tablet Take 25 mg by mouth daily.     losartan (COZAAR) 25 MG tablet Take 1/2 tablet (12.5 mg total) by mouth daily. 30 tablet 0   montelukast (SINGULAIR) 10 MG tablet Take 10 mg by mouth at bedtime.     spironolactone (ALDACTONE) 25 MG tablet Take 1/2 tablet (12.5 mg total) by mouth daily. 30 tablet 0   TRELEGY ELLIPTA 100-62.5-25 MCG/ACT AEPB Take 1 puff by mouth daily.     No current facility-administered medications for this encounter.  Facility-Administered Medications Ordered in Other Encounters  Medication Dose Route Frequency Provider Last Rate Last Admin   perflutren lipid microspheres (DEFINITY) IV suspension  1-10 mL Intravenous PRN Clegg, Amy D, NP   1.5 mL at 09/18/21 1445    Vitals:   09/18/21 1521  BP: 110/70  Pulse: 98  SpO2: 96%  Weight: 70 kg (154 lb 6.4 oz)    Wt Readings from Last 3 Encounters:  09/18/21 70 kg (154 lb 6.4 oz)  07/18/21 69.8 kg (153 lb 12.8 oz)  06/27/21 74.5 kg (164 lb 3.9 oz)    PHYSICAL EXAM: General:  Well appearing. No resp difficulty HEENT: normal Neck: supple. no JVD. Carotids 2+ bilat; no bruits. No lymphadenopathy or thryomegaly appreciated. Cor: PMI nondisplaced. Regular rate &  rhythm. No rubs, gallops or murmurs. Lungs: clear Abdomen: soft, nontender, nondistended. No hepatosplenomegaly. No bruits or masses. Good bowel sounds. Extremities: no cyanosis, clubbing, rash, edema Neuro: alert & orientedx3, cranial nerves grossly intact. moves all 4 extremities w/o difficulty. Affect pleasant   ASSESSMENT & PLAN: 1. HFiEF Echo 06/20/21 ---> EF 20% possible Takotsubo CMP--->Echo 5/25 EF improved to 60-65% .  Cath 5/18 - Normal cors Filling pressures mildly elevated, CO 4.6, CI 2.2  PCP stopped jardiance.  For now continue spiro and losartan.   Echo today discussed and reviewed by Dr Gala Romney. EF remains 55-60%.   2. Sinus Tach  -Heart rate in 90s but at home in 80s.  -On Lopressor per PCP.   3. H/O Seizure -Per PCP  4. COPD FEV1 24% Sats stable on room air.  Followed by Pulmonary   Tonye Becket NP-C  3:46 PM  Patient seen and examined with the above-signed Advanced Practice Provider and/or Housestaff. I personally reviewed laboratory data, imaging studies and relevant notes. I independently examined the patient and formulated the important aspects of the plan. I have edited the note to reflect any of my changes or salient points. I have personally discussed the plan with the patient and/or family.  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 F/u echo prior to discharge showed recovered EF. Normal cors on cath  Returns for f/u. Doing well. Has quit smoking.   General:  Well appearing. No resp difficulty HEENT: normal Neck: supple. no JVD. Carotids 2+ bilat; no bruits. No lymphadenopathy or thryomegaly appreciated. Cor: PMI nondisplaced. Regular rate & rhythm. No rubs, gallops or murmurs. Lungs: clear decreased throughout Abdomen: soft, nontender, nondistended. No hepatosplenomegaly. No bruits or masses. Good bowel sounds. Extremities: no cyanosis, clubbing, rash, edema Neuro: alert & orientedx3,  cranial nerves grossly intact. moves all 4 extremities w/o difficulty. Affect pleasant  EF completely recovered on echo today. Doing well. Continue current meds. F/u [facility].   Arvilla Meres, MD  4:41 PM

## 2021-09-18 NOTE — Patient Instructions (Signed)
There has been no changes to your medications.  Your physician recommends that you schedule a follow-up appointment in: 1 year with your General Cardiologist.  If you have any questions, issues, or concerns before your next appointment please call our office at [phone], opt. 2 and leave a message for the triage nurse.

## 2021-10-01 DIAGNOSIS — E119 Type 2 diabetes mellitus without complications: Secondary | ICD-10-CM | POA: Diagnosis not present

## 2021-10-01 DIAGNOSIS — Z87891 Personal history of nicotine dependence: Secondary | ICD-10-CM | POA: Diagnosis not present

## 2021-10-01 DIAGNOSIS — F319 Bipolar disorder, unspecified: Secondary | ICD-10-CM | POA: Diagnosis not present

## 2021-10-01 DIAGNOSIS — E785 Hyperlipidemia, unspecified: Secondary | ICD-10-CM | POA: Diagnosis not present

## 2021-10-01 DIAGNOSIS — D582 Other hemoglobinopathies: Secondary | ICD-10-CM | POA: Diagnosis not present

## 2021-10-01 DIAGNOSIS — I10 Essential (primary) hypertension: Secondary | ICD-10-CM | POA: Diagnosis not present

## 2021-10-01 DIAGNOSIS — I700 Atherosclerosis of aorta: Secondary | ICD-10-CM | POA: Diagnosis not present

## 2021-10-01 DIAGNOSIS — J449 Chronic obstructive pulmonary disease, unspecified: Secondary | ICD-10-CM | POA: Diagnosis not present

## 2021-10-01 DIAGNOSIS — Z8679 Personal history of other diseases of the circulatory system: Secondary | ICD-10-CM | POA: Diagnosis not present

## 2021-11-03 NOTE — Progress Notes (Deleted)
Psychiatric Initial Adult Assessment   Patient Identification: Karen Perry MRN:  [id number] Date of Evaluation:  11/03/2021 Referral Source: *** Chief Complaint:  No chief complaint on file.  Visit Diagnosis: No diagnosis found.  History of Present Illness:   Karen Perry is a 56 y.o. 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, , who is referred for   Daily routine: Diet:  Exercise: Support: Household:  Marital status: Number of children: Employment:  Education:   Last PCP / ongoing medical evaluation:    Associated Signs/Symptoms: Depression Symptoms:  {DEPRESSION SYMPTOMS:20000} (Hypo) Manic Symptoms:  {BHH MANIC SYMPTOMS:22872} Anxiety Symptoms:  {BHH ANXIETY SYMPTOMS:22873} Psychotic Symptoms:  {BHH PSYCHOTIC SYMPTOMS:22874} PTSD Symptoms: {BHH PTSD SYMPTOMS:22875}  Past Psychiatric History:  Outpatient:  Psychiatry admission:  Previous suicide attempt:  Past trials of medication:  History of violence:    Previous Psychotropic Medications: {YES/NO:21197}  Substance Abuse History in the last 12 months:  {yes no:314532}  Consequences of Substance Abuse: {BHH CONSEQUENCES OF SUBSTANCE ABUSE:22880}  Past Medical History:  Past Medical History:  Diagnosis Date   Asthma    Bipolar 1 disorder (HCC)    Diabetes mellitus without complication (HCC)    Hypertension    Seizures (HCC)     Past Surgical History:  Procedure Laterality Date   ARTERIAL LINE INSERTION N/A 06/20/2021   Procedure: ARTERIAL LINE INSERTION;  Surgeon: Dolores Patty, MD;  Location: [facility];  Service: Cardiovascular;  Laterality: N/A;   BACK SURGERY     RIGHT/LEFT HEART CATH AND CORONARY ANGIOGRAPHY N/A 06/20/2021   Procedure: RIGHT/LEFT HEART CATH AND CORONARY ANGIOGRAPHY;  Surgeon: Dolores Patty, MD;  Location: [facility];  Service: Cardiovascular;  Laterality: N/A;    Family  Psychiatric History: ***  Family History:  Family History  Adopted: Yes    Social History:   Social History   Socioeconomic History   Marital status: Married    Spouse name: Not on file   Number of children: Not on file   Years of education: Not on file   Highest education level: Not on file  Occupational History   Not on file  Tobacco Use   Smoking status: Every Day    Packs/day: 0.50    Types: Cigarettes   Smokeless tobacco: Not on file  Substance and Sexual Activity   Alcohol use: No   Drug use: Not on file   Sexual activity: Not on file  Other Topics Concern   Not on file  Social History Narrative   Not on file   Social Determinants of Health   Financial Resource Strain: Not on file  Food Insecurity: Not on file  Transportation Needs: Not on file  Physical Activity: Not on file  Stress: Not on file  Social Connections: Not on file    Additional Social History: ***  Allergies:   Allergies  Allergen Reactions   Septra [Sulfamethoxazole-Trimethoprim] Hives    Metabolic Disorder Labs: Lab Results  Component Value Date   HGBA1C 5.6 06/21/2021   MPG 114.02 06/21/2021   No results found for: "PROLACTIN" Lab Results  Component Value Date   CHOL 218 (H) 06/20/2021   TRIG 142 06/20/2021   HDL 55 06/20/2021   CHOLHDL 4.0 06/20/2021   VLDL 28 06/20/2021   LDLCALC 135 (H) 06/20/2021   LDLCALC (H) 02/25/2009    116        Total Cholesterol/HDL:CHD Risk Coronary Heart  Disease Risk Table                     Men   Women  1/2 Average Risk   3.4   3.3  Average Risk       5.0   4.4  2 X Average Risk   9.6   7.1  3 X Average Risk  23.4   11.0        Use the calculated Patient Ratio above and the CHD Risk Table to determine the patient's CHD Risk.        ATP III CLASSIFICATION (LDL):  <100     mg/dL   Optimal  347-425  mg/dL   Near or Above                    Optimal  130-159  mg/dL   Borderline  956-387  mg/dL   High  >564     mg/dL   Very High    Lab Results  Component Value Date   TSH 0.318 (L) 06/20/2021    Therapeutic Level Labs: No results found for: "LITHIUM" No results found for: "CBMZ" No results found for: "VALPROATE"  Current Medications: Current Outpatient Medications  Medication Sig Dispense Refill   atorvastatin (LIPITOR) 10 MG tablet Take 10 mg by mouth daily.     lamoTRIgine (LAMICTAL) 25 MG tablet Take 25 mg by mouth daily.     losartan (COZAAR) 25 MG tablet Take 1/2 tablet (12.5 mg total) by mouth daily. 30 tablet 0   metoprolol tartrate (LOPRESSOR) 25 MG tablet Take 12.5 mg by mouth 2 (two) times daily.     montelukast (SINGULAIR) 10 MG tablet Take 10 mg by mouth at bedtime.     spironolactone (ALDACTONE) 25 MG tablet Take 1/2 tablet (12.5 mg total) by mouth daily. 30 tablet 0   TRELEGY ELLIPTA 100-62.5-25 MCG/ACT AEPB Take 1 puff by mouth daily.     No current facility-administered medications for this visit.    Musculoskeletal: Strength & Muscle Tone: within normal limits Gait & Station: normal Patient leans: N/A  Psychiatric Specialty Exam: Review of Systems  Last menstrual period 10/16/2014.There is no height or weight on file to calculate BMI.  General Appearance: {Appearance:22683}  Eye Contact:  {BHH EYE CONTACT:22684}  Speech:  Clear and Coherent  Volume:  Normal  Mood:  {BHH MOOD:22306}  Affect:  {Affect (PAA):22687}  Thought Process:  Coherent  Orientation:  Full (Time, Place, and Person)  Thought Content:  Logical  Suicidal Thoughts:  {ST/HT (PAA):22692}  Homicidal Thoughts:  {ST/HT (PAA):22692}  Memory:  Immediate;   Good  Judgement:  {Judgement (PAA):22694}  Insight:  {Insight (PAA):22695}  Psychomotor Activity:  Normal  Concentration:  Concentration: Good and Attention Span: Good  Recall:  Good  Fund of Knowledge:Good  Language: Good  Akathisia:  No  Handed:  Right  AIMS (if indicated):  not done  Assets:  Communication Skills Desire for Improvement  ADL's:  Intact   Cognition: WNL  Sleep:  {BHH GOOD/FAIR/POOR:22877}   Screenings: Flowsheet Row ED to Hosp-Admission (Discharged) from 06/20/2021 in [facility] PRO[facility]ARE  C-SSRS RISK CATEGORY No Risk       Assessment and Plan:  Assessment  Plan   The patient demonstrates the following risk factors for suicide: Chronic risk factors for suicide include: {Chronic Risk Factors for PPIRJJO:84166063}. Acute risk factors for suicide include: {Acute Risk Factors for KZSWFUX:32355732}. Protective factors for this patient include: {Protective  Factors for Suicide PTWS:56812751}. Considering these factors, the overall suicide risk at this point appears to be {Desc; low/moderate/high:110033}. Patient {ACTION; IS/IS ZGY:17494496} appropriate for outpatient follow up.       Collaboration of Care: {BH OP Collaboration of Care:21014065}  Patient/Guardian was advised Release of Information must be obtained prior to any record release in order to collaborate their care with an outside provider. Patient/Guardian was advised if they have not already done so to contact the registration department to sign all necessary forms in order for [place] to release information regarding their care.   Consent: Patient/Guardian gives verbal consent for treatment and assignment of benefits for services provided during this visit. Patient/Guardian expressed understanding and agreed to proceed.   Norman Clay, MD 10/1/20234:28 PM

## 2021-11-05 ENCOUNTER — Ambulatory Visit: Payer: Medicare HMO | Admitting: Psychiatry

## 2022-01-22 ENCOUNTER — Encounter: Payer: Self-pay | Admitting: Cardiovascular Disease

## 2022-01-22 NOTE — Progress Notes (Signed)
Cardiology Office Note:    Date:  01/24/2022   ID:  Karen Perry, DOB Nov 15, 1965, MRN [id number]  PCP:  Lynnea Ferrier, MD   [facility] Providers Cardiologist:  Bensimhon,, Chizara Mena  Click to update primary MD,subspecialty MD or APP then REFRESH:1}    Referring MD: Dolores Patty, MD   Chief Complaint  Patient presents with   takotsubo cardioyopathy   COPD    History of Present Illness:  01/23/22    Karen Perry is a 56 y.o. female with a hx of bipolar disease, DM, takotsubo cardiomyopathy,  severe COPD   Seen with husband , Karen Perry.   She has been seen by Dr Gala Romney for Takotsubo cardiomyopathy.  She has severe COPD. Her LV function has normalized and she is now referred to general cardiology for follow up .   Previously was seen by Dr. Juliann Pares,   Still has some palpitations  Better on metoprolol   Not much exercise  Has severe COPD , has stopped smoking  Echocardiogram from September 18, 2021 reveals normal left ventricular systolic function with EF of 60 to 65%.  Diastolic parameters were normal.  RV size and function were normal.  No significant valvular disease.    Avoiding salt , avoids processed foods.   Fam hx - she is adopted.  So no family hx available   Past Medical History:  Diagnosis Date   Asthma    Bipolar 1 disorder (HCC)    Diabetes mellitus without complication (HCC)    Hypertension    Seizures (HCC)     Past Surgical History:  Procedure Laterality Date   ARTERIAL LINE INSERTION N/A 06/20/2021   Procedure: ARTERIAL LINE INSERTION;  Surgeon: Dolores Patty, MD;  Location: [facility];  Service: Cardiovascular;  Laterality: N/A;   BACK SURGERY     RIGHT/LEFT HEART CATH AND CORONARY ANGIOGRAPHY N/A 06/20/2021   Procedure: RIGHT/LEFT HEART CATH AND CORONARY ANGIOGRAPHY;  Surgeon: Dolores Patty, MD;  Location: [facility];  Service: Cardiovascular;  Laterality: N/A;    Current  Medications: Current Meds  Medication Sig   albuterol (PROVENTIL) (2.5 MG/3ML) 0.083% nebulizer solution Take 2.5 mg by nebulization every 6 (six) hours as needed for wheezing or shortness of breath.   albuterol (VENTOLIN HFA) 108 (90 Base) MCG/ACT inhaler Inhale 1-2 puffs into the lungs every 6 (six) hours as needed for wheezing or shortness of breath.   atorvastatin (LIPITOR) 10 MG tablet Take 10 mg by mouth daily.   furosemide (LASIX) 20 MG tablet Take 20 mg by mouth daily as needed for fluid or edema.   lamoTRIgine (LAMICTAL) 100 MG tablet Take 25 mg by mouth daily.   losartan (COZAAR) 25 MG tablet Take 1/2 tablet (12.5 mg total) by mouth daily.   metoprolol tartrate (LOPRESSOR) 25 MG tablet Take 12.5 mg by mouth 2 (two) times daily.   montelukast (SINGULAIR) 10 MG tablet Take 10 mg by mouth at bedtime.   spironolactone (ALDACTONE) 25 MG tablet Take 1/2 tablet (12.5 mg total) by mouth daily.   TRELEGY ELLIPTA 100-62.5-25 MCG/ACT AEPB Take 1 puff by mouth daily.     Allergies:   Septra [sulfamethoxazole-trimethoprim]   Social History   Socioeconomic History   Marital status: Married    Spouse name: Not on file   Number of children: Not on file   Years of education: Not on file   Highest education level: Not on file  Occupational History  Not on file  Tobacco Use   Smoking status: Every Day    Packs/day: 0.50    Types: Cigarettes   Smokeless tobacco: Not on file  Substance and Sexual Activity   Alcohol use: No   Drug use: Not on file   Sexual activity: Not on file  Other Topics Concern   Not on file  Social History Narrative   Not on file   Social Determinants of Health   Financial Resource Strain: Not on file  Food Insecurity: Not on file  Transportation Needs: Not on file  Physical Activity: Not on file  Stress: Not on file  Social Connections: Not on file     Family History: The patient's family history is not on file. She was adopted.  ROS:   Please see  the history of present illness.     All other systems reviewed and are negative.  EKGs/Labs/Other Studies Reviewed:    The following studies were reviewed today:    EKG:   Dec, 21, 2023 normal sinus rhythm at 64.  Right axis deviation.   Recent Labs: 06/20/2021: B Natriuretic Peptide 2,324.8; TSH 0.318 06/26/2021: ALT 32 06/27/2021: Hemoglobin 15.6; Magnesium 2.3; Platelets 152 01/23/2022: BUN 11; Creatinine, Ser 0.73; Potassium 4.0; Sodium 141  Recent Lipid Panel    Component Value Date/Time   CHOL 201 (H) 01/23/2022 1624   TRIG 90 01/23/2022 1624   HDL 49 01/23/2022 1624   CHOLHDL 4.1 01/23/2022 1624   CHOLHDL 4.0 06/20/2021 1500   VLDL 28 06/20/2021 1500   LDLCALC 136 (H) 01/23/2022 1624     Risk Assessment/Calculations:                Physical Exam:    VS:  BP 118/82   Pulse 68   Ht 5\' 9"  (1.753 m)   Wt 155 lb (70.3 kg)   LMP 10/16/2014   SpO2 97%   BMI 22.89 kg/m     Wt Readings from Last 3 Encounters:  01/23/22 155 lb (70.3 kg)  09/18/21 154 lb 6.4 oz (70 kg)  07/18/21 153 lb 12.8 oz (69.8 kg)     GEN:  Well nourished, well developed in no acute distress HEENT: Normal NECK: No JVD; No carotid bruits LYMPHATICS: No lymphadenopathy CARDIAC: RRR, no murmurs, rubs, gallops RESPIRATORY:  Clear to auscultation without rales, wheezing or rhonchi  ABDOMEN: Soft, non-tender, non-distended MUSCULOSKELETAL:  No edema; No deformity  SKIN: Warm and dry NEUROLOGIC:  Alert and oriented x 3 PSYCHIATRIC:  Normal affect   ASSESSMENT:    1. Chronic combined systolic and diastolic heart failure (HCC)    PLAN:    In order of problems listed above:  History of Takotsubo syndrome: Patient has a history of Takotsubo syndrome in May.  Her left ventricular systolic function has already improved.  Will continue current medications. She had a normal cardiac cath.   2.  Coronary artery calcifications: She did have coronary artery calcifications identified by a  pulmonary embolus protocol CT scan.  I would like to get a formal coronary calcium score to further risk assess her.  Her LDL is elevated.  If she has a high coronary calcium score I think we should go ahead and be more proactive and an aim for an LDL goal of 70 or below.  Will see her again in 1 year.              Medication Adjustments/Labs and Tests Ordered: Current medicines are reviewed at length with the  patient today.  Concerns regarding medicines are outlined above.  Orders Placed This Encounter  Procedures   CT CARDIAC SCORING (SELF PAY ONLY)   Lipid panel   Basic metabolic panel   Lipoprotein A (LPA)   EKG 12-Lead   No orders of the defined types were placed in this encounter.   Patient Instructions  Medication Instructions:  Your physician recommends that you continue on your current medications as directed. Please refer to the Current Medication list given to you today.  *If you need a refill on your cardiac medications before your next appointment, please call your pharmacy*  Lab Work: Lipids, BMET, LP(a) today If you have labs (blood work) drawn today and your tests are completely normal, you will receive your results only by: MyChart Message (if you have MyChart) OR A paper copy in the mail If you have any lab test that is abnormal or we need to change your treatment, we will call you to review the results.  Testing/Procedures: Coronary Calcium Score CT Your physician has requested that you have cardiac CT. Cardiac computed tomography (CT) is a painless test that uses an x-ray machine to take clear, detailed pictures of your heart. For further information please visit [email or web address] Please follow instruction sheet as given.  Follow-Up: At [facility], you and your health needs are our priority.  As part of our continuing mission to provide you with exceptional heart care, we have created designated Provider Care Teams.  These Care Teams  include your primary Cardiologist (physician) and Advanced Practice Providers (APPs -  Physician Assistants and Nurse Practitioners) who all work together to provide you with the care you need, when you need it.  We recommend signing up for the patient portal called "MyChart".  Sign up information is provided on this After Visit Summary.  MyChart is used to connect with patients for Virtual Visits (Telemedicine).  Patients are able to view lab/test results, encounter notes, upcoming appointments, etc.  Non-urgent messages can be sent to your provider as well.   To learn more about what you can do with MyChart, go to [email or web address].    Your next appointment:   1 year(s)  The format for your next appointment:   In Person  Provider:   Kristeen Miss, MD    Important Information About Sugar         Signed, Kristeen Miss, MD  01/24/2022 6:40 AM    [facility]

## 2022-01-23 ENCOUNTER — Encounter: Payer: Self-pay | Admitting: Cardiovascular Disease

## 2022-01-23 ENCOUNTER — Ambulatory Visit: Payer: Medicare HMO | Attending: Cardiovascular Disease | Admitting: Cardiovascular Disease

## 2022-01-23 VITALS — BP 118/82 | HR 68 | Ht 69.0 in | Wt 155.0 lb

## 2022-01-23 DIAGNOSIS — Z5181 Encounter for therapeutic drug level monitoring: Secondary | ICD-10-CM | POA: Diagnosis not present

## 2022-01-23 DIAGNOSIS — E785 Hyperlipidemia, unspecified: Secondary | ICD-10-CM | POA: Diagnosis not present

## 2022-01-23 DIAGNOSIS — I5042 Chronic combined systolic (congestive) and diastolic (congestive) heart failure: Secondary | ICD-10-CM | POA: Diagnosis not present

## 2022-01-23 NOTE — Patient Instructions (Signed)
Medication Instructions:  Your physician recommends that you continue on your current medications as directed. Please refer to the Current Medication list given to you today.  *If you need a refill on your cardiac medications before your next appointment, please call your pharmacy*  Lab Work: Lipids, BMET, LP(a) today If you have labs (blood work) drawn today and your tests are completely normal, you will receive your results only by: MyChart Message (if you have MyChart) OR A paper copy in the mail If you have any lab test that is abnormal or we need to change your treatment, we will call you to review the results.  Testing/Procedures: Coronary Calcium Score CT Your physician has requested that you have cardiac CT. Cardiac computed tomography (CT) is a painless test that uses an x-ray machine to take clear, detailed pictures of your heart. For further information please visit [email or web address] Please follow instruction sheet as given.  Follow-Up: At [facility], you and your health needs are our priority.  As part of our continuing mission to provide you with exceptional heart care, we have created designated Provider Care Teams.  These Care Teams include your primary Cardiologist (physician) and Advanced Practice Providers (APPs -  Physician Assistants and Nurse Practitioners) who all work together to provide you with the care you need, when you need it.  We recommend signing up for the patient portal called "MyChart".  Sign up information is provided on this After Visit Summary.  MyChart is used to connect with patients for Virtual Visits (Telemedicine).  Patients are able to view lab/test results, encounter notes, upcoming appointments, etc.  Non-urgent messages can be sent to your provider as well.   To learn more about what you can do with MyChart, go to [email or web address].    Your next appointment:   1 year(s)  The format for your next appointment:   In  Person  Provider:   Kristeen Miss, MD    Important Information About Sugar

## 2022-01-24 LAB — BASIC METABOLIC PANEL
BUN/Creatinine Ratio: 15 (ref 9–23)
BUN: 11 mg/dL (ref 6–24)
CO2: 24 mmol/L (ref 20–29)
Calcium: 9.9 mg/dL (ref 8.7–10.2)
Chloride: 103 mmol/L (ref 96–106)
Creatinine, Ser: 0.73 mg/dL (ref 0.57–1.00)
Glucose: 87 mg/dL (ref 70–99)
Potassium: 4 mmol/L (ref 3.5–5.2)
Sodium: 141 mmol/L (ref 134–144)
eGFR: 97 mL/min/{1.73_m2} (ref >59)

## 2022-01-24 LAB — LIPID PANEL
Chol/HDL Ratio: 4.1 ratio (ref 0.0–4.4)
Cholesterol, Total: 201 mg/dL — ABNORMAL HIGH (ref 100–199)
HDL: 49 mg/dL (ref >39)
LDL Chol Calc (NIH): 136 mg/dL — ABNORMAL HIGH (ref 0–99)
Triglycerides: 90 mg/dL (ref 0–149)
VLDL Cholesterol Cal: 16 mg/dL (ref 5–40)

## 2022-01-24 LAB — LIPOPROTEIN A (LPA): Lipoprotein (a): 28 nmol/L (ref <75.0)

## 2022-02-12 ENCOUNTER — Other Ambulatory Visit: Payer: Self-pay | Admitting: Internal Medicine

## 2022-02-12 DIAGNOSIS — Z1231 Encounter for screening mammogram for malignant neoplasm of breast: Secondary | ICD-10-CM

## 2022-03-07 ENCOUNTER — Ambulatory Visit (HOSPITAL_BASED_OUTPATIENT_CLINIC_OR_DEPARTMENT_OTHER): Payer: Medicare HMO

## 2022-03-24 ENCOUNTER — Ambulatory Visit (HOSPITAL_BASED_OUTPATIENT_CLINIC_OR_DEPARTMENT_OTHER): Payer: Medicare HMO | Attending: Cardiovascular Disease

## 2022-04-02 DIAGNOSIS — E118 Type 2 diabetes mellitus with unspecified complications: Secondary | ICD-10-CM | POA: Diagnosis not present

## 2022-04-02 DIAGNOSIS — E785 Hyperlipidemia, unspecified: Secondary | ICD-10-CM | POA: Diagnosis not present

## 2022-04-02 DIAGNOSIS — D582 Other hemoglobinopathies: Secondary | ICD-10-CM | POA: Diagnosis not present

## 2022-04-03 DIAGNOSIS — F319 Bipolar disorder, unspecified: Secondary | ICD-10-CM | POA: Diagnosis not present

## 2022-04-03 DIAGNOSIS — I700 Atherosclerosis of aorta: Secondary | ICD-10-CM | POA: Diagnosis not present

## 2022-04-03 DIAGNOSIS — J45909 Unspecified asthma, uncomplicated: Secondary | ICD-10-CM | POA: Diagnosis not present

## 2022-04-03 DIAGNOSIS — E119 Type 2 diabetes mellitus without complications: Secondary | ICD-10-CM | POA: Diagnosis not present

## 2022-04-03 DIAGNOSIS — Z87891 Personal history of nicotine dependence: Secondary | ICD-10-CM | POA: Diagnosis not present

## 2022-04-03 DIAGNOSIS — E785 Hyperlipidemia, unspecified: Secondary | ICD-10-CM | POA: Diagnosis not present

## 2022-04-03 DIAGNOSIS — J449 Chronic obstructive pulmonary disease, unspecified: Secondary | ICD-10-CM | POA: Diagnosis not present

## 2022-04-03 DIAGNOSIS — I110 Hypertensive heart disease with heart failure: Secondary | ICD-10-CM | POA: Diagnosis not present

## 2022-04-03 DIAGNOSIS — I5022 Chronic systolic (congestive) heart failure: Secondary | ICD-10-CM | POA: Diagnosis not present

## 2022-04-30 DIAGNOSIS — J449 Chronic obstructive pulmonary disease, unspecified: Secondary | ICD-10-CM | POA: Diagnosis not present

## 2022-04-30 DIAGNOSIS — F418 Other specified anxiety disorders: Secondary | ICD-10-CM | POA: Diagnosis not present

## 2022-04-30 DIAGNOSIS — R0602 Shortness of breath: Secondary | ICD-10-CM | POA: Diagnosis not present

## 2022-04-30 DIAGNOSIS — R0902 Hypoxemia: Secondary | ICD-10-CM | POA: Diagnosis not present

## 2022-05-07 DIAGNOSIS — I110 Hypertensive heart disease with heart failure: Secondary | ICD-10-CM | POA: Diagnosis not present

## 2022-05-07 DIAGNOSIS — I700 Atherosclerosis of aorta: Secondary | ICD-10-CM | POA: Diagnosis not present

## 2022-05-07 DIAGNOSIS — J9610 Chronic respiratory failure, unspecified whether with hypoxia or hypercapnia: Secondary | ICD-10-CM | POA: Diagnosis not present

## 2022-05-07 DIAGNOSIS — F319 Bipolar disorder, unspecified: Secondary | ICD-10-CM | POA: Diagnosis not present

## 2022-05-07 DIAGNOSIS — J4489 Other specified chronic obstructive pulmonary disease: Secondary | ICD-10-CM | POA: Diagnosis not present

## 2022-05-07 DIAGNOSIS — I5020 Unspecified systolic (congestive) heart failure: Secondary | ICD-10-CM | POA: Diagnosis not present

## 2022-05-07 DIAGNOSIS — E119 Type 2 diabetes mellitus without complications: Secondary | ICD-10-CM | POA: Diagnosis not present

## 2022-05-29 ENCOUNTER — Institutional Professional Consult (permissible substitution): Payer: Medicare HMO | Admitting: Emergency Medicine

## 2022-06-16 ENCOUNTER — Institutional Professional Consult (permissible substitution): Payer: Medicare HMO | Admitting: Pulmonary Disease

## 2022-06-26 ENCOUNTER — Encounter: Payer: Self-pay | Admitting: Pulmonary Disease

## 2022-07-10 DIAGNOSIS — I700 Atherosclerosis of aorta: Secondary | ICD-10-CM | POA: Diagnosis not present

## 2022-07-10 DIAGNOSIS — F319 Bipolar disorder, unspecified: Secondary | ICD-10-CM | POA: Diagnosis not present

## 2022-07-10 DIAGNOSIS — Z87891 Personal history of nicotine dependence: Secondary | ICD-10-CM | POA: Diagnosis not present

## 2022-07-10 DIAGNOSIS — I110 Hypertensive heart disease with heart failure: Secondary | ICD-10-CM | POA: Diagnosis not present

## 2022-07-10 DIAGNOSIS — E119 Type 2 diabetes mellitus without complications: Secondary | ICD-10-CM | POA: Diagnosis not present

## 2022-07-10 DIAGNOSIS — J9610 Chronic respiratory failure, unspecified whether with hypoxia or hypercapnia: Secondary | ICD-10-CM | POA: Diagnosis not present

## 2022-07-10 DIAGNOSIS — I5020 Unspecified systolic (congestive) heart failure: Secondary | ICD-10-CM | POA: Diagnosis not present

## 2022-07-10 DIAGNOSIS — J4489 Other specified chronic obstructive pulmonary disease: Secondary | ICD-10-CM | POA: Diagnosis not present

## 2022-07-10 DIAGNOSIS — D582 Other hemoglobinopathies: Secondary | ICD-10-CM | POA: Diagnosis not present

## 2022-09-17 DIAGNOSIS — Z9981 Dependence on supplemental oxygen: Secondary | ICD-10-CM | POA: Diagnosis not present

## 2022-09-17 DIAGNOSIS — J029 Acute pharyngitis, unspecified: Secondary | ICD-10-CM | POA: Diagnosis not present

## 2022-09-17 DIAGNOSIS — U071 COVID-19: Secondary | ICD-10-CM | POA: Diagnosis not present

## 2022-09-17 DIAGNOSIS — R051 Acute cough: Secondary | ICD-10-CM | POA: Diagnosis not present

## 2022-09-17 IMAGING — CT CT RENAL STONE PROTOCOL
2 of 4 series · 16 of 46 positions shown, 18 images · non-contrast
Comparison: CT abdomen 02/10/2018

CLINICAL DATA: Right flank pain extending anteriorly to umbilicus x
3 weeks. Microhematuria seen on UA. Nausea as well. No surgeries.

EXAM:
CT ABDOMEN AND PELVIS WITHOUT CONTRAST
TECHNIQUE: Multidetector CT imaging of the abdomen and pelvis was performed
following the standard protocol without IV contrast.

[Series 2: stone full standard · axial · 0.67mm/px · z∈[-954,-598]mm · 13 of 79 slices shown, 15 images]
[im 4/79  soft-tissue]
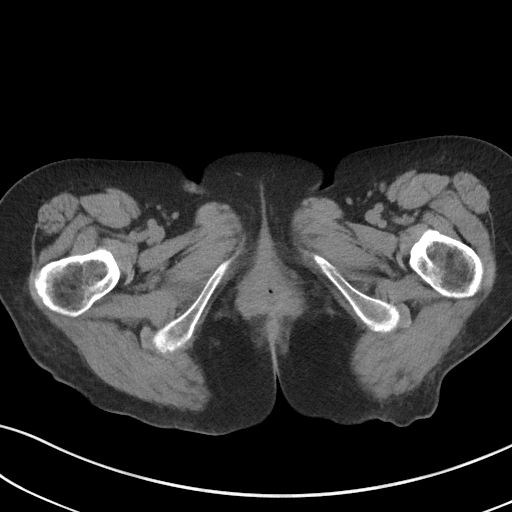
[im 4/79  bone]
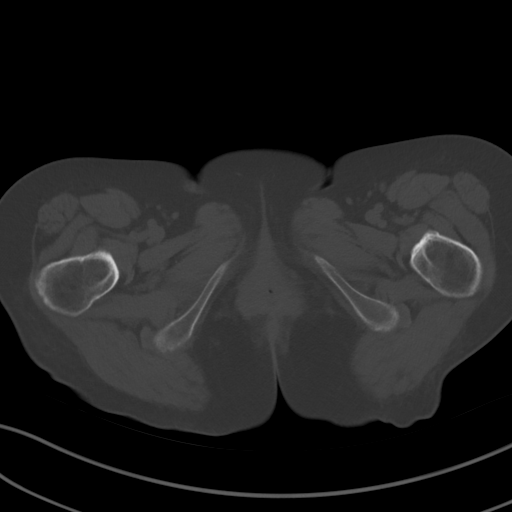
[im 10/79  soft-tissue]
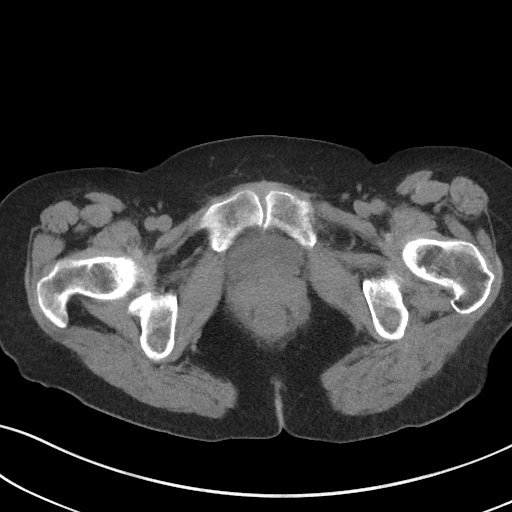
[im 16/79  soft-tissue]
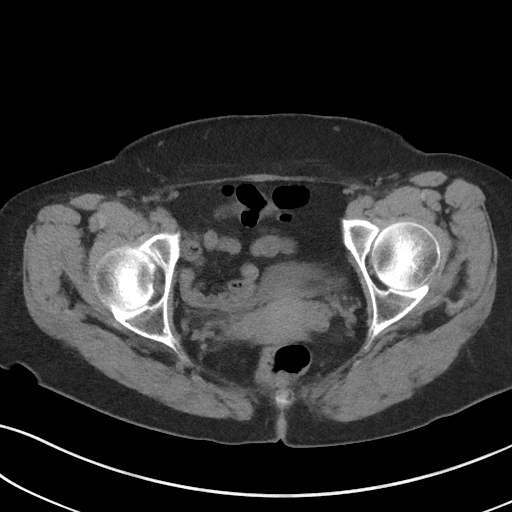
[im 22/79  soft-tissue]
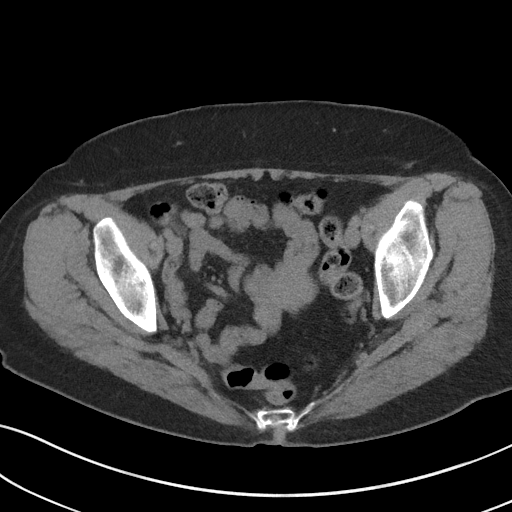
[im 29/79  soft-tissue]
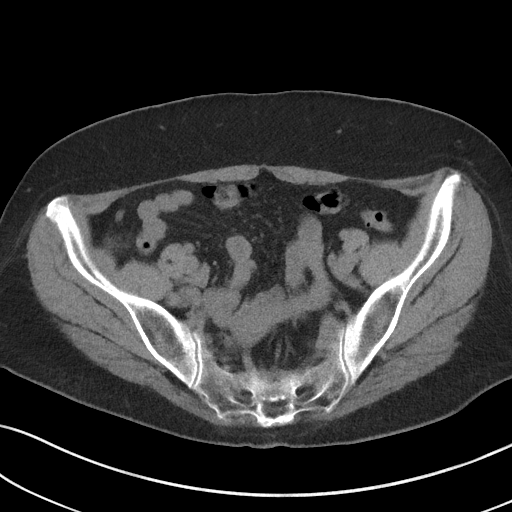
[im 35/79  soft-tissue]
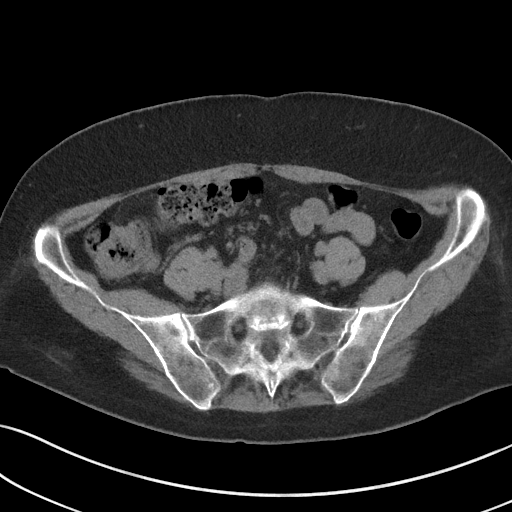
[im 41/79  soft-tissue]
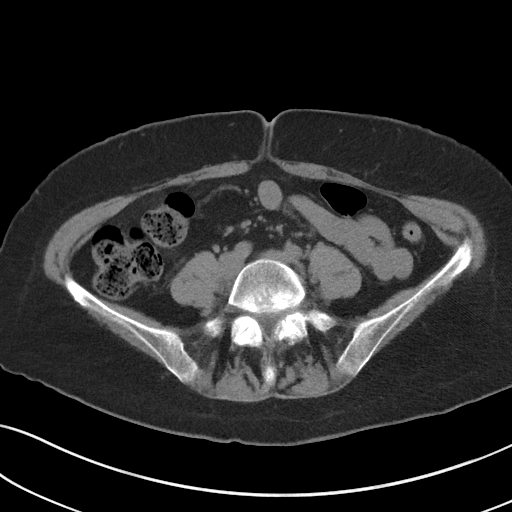
[im 44/79  soft-tissue]
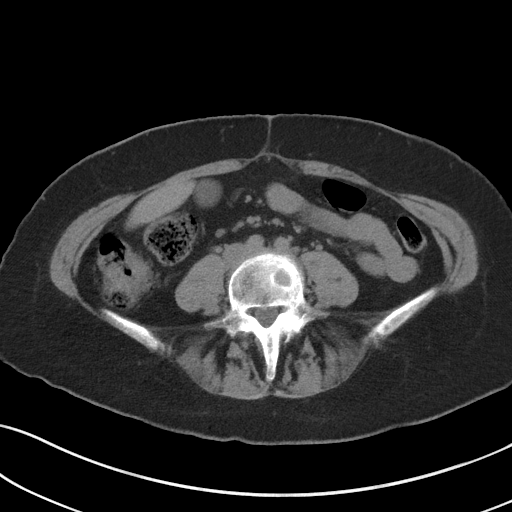
[im 50/79  soft-tissue]
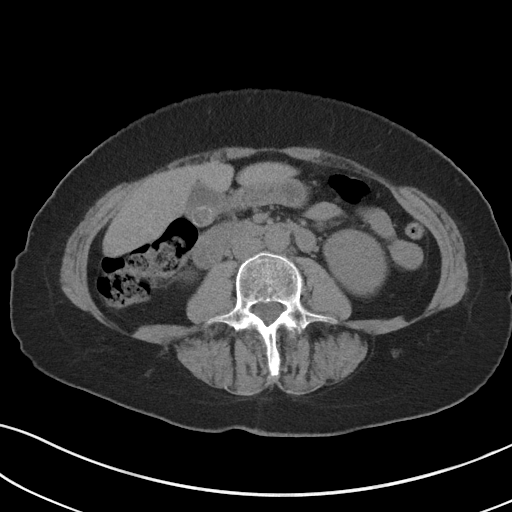
[im 50/79  bone]
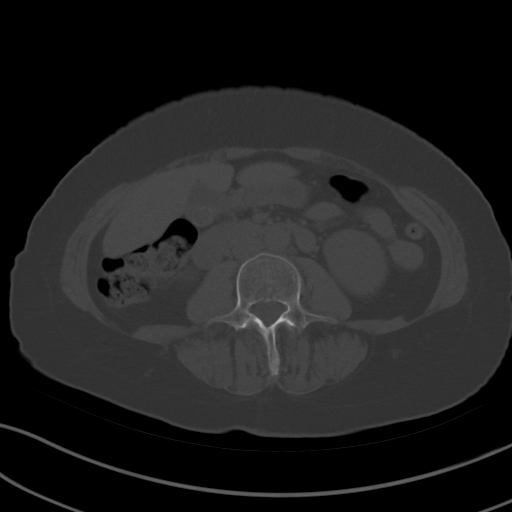
[im 57/79  soft-tissue]
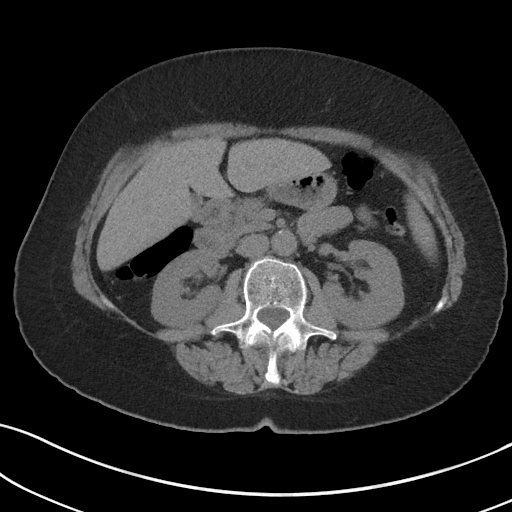
[im 63/79  soft-tissue]
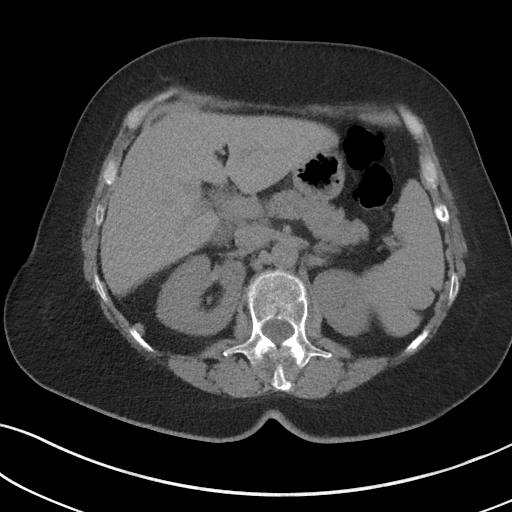
[im 69/79  soft-tissue]
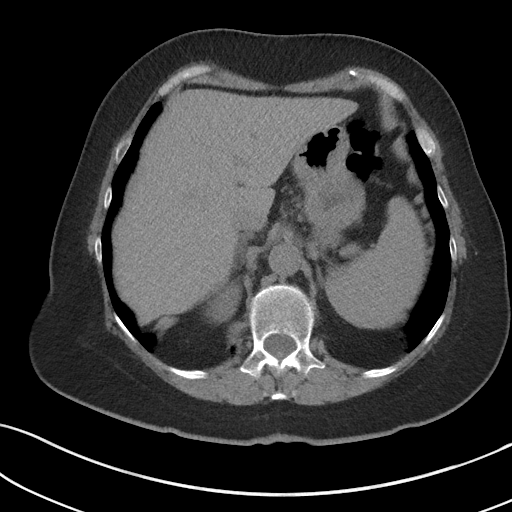
[im 75/79  soft-tissue]
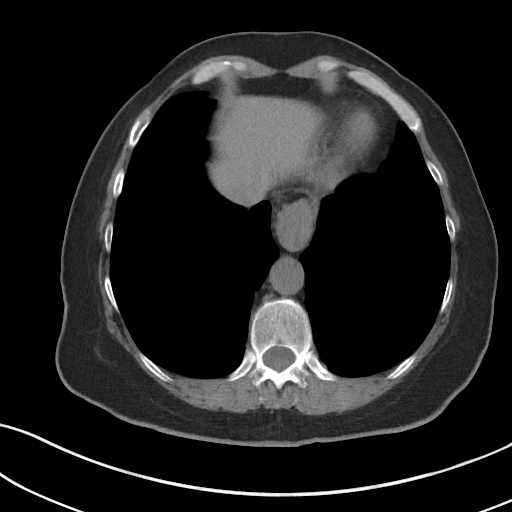

[Series 5: coronal · coronal · 0.77mm/px · 3 of 143 slices shown]
[im 48/143  soft-tissue]
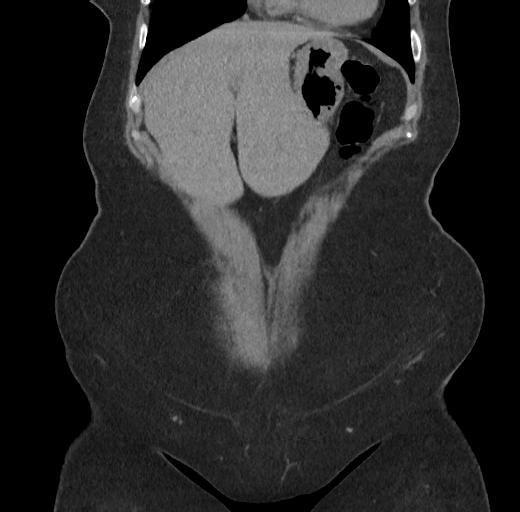
[im 64/143  soft-tissue]
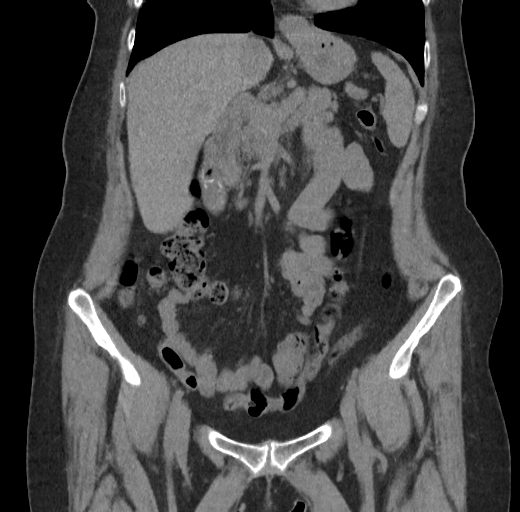
[im 79/143  soft-tissue]
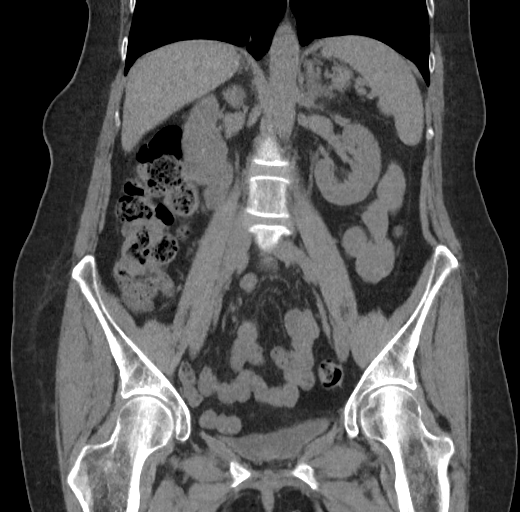

[16 of 46 positions shown; findings below may reference images not displayed]

FINDINGS: Lower chest: No acute abnormality.

Hepatobiliary: No focal liver abnormality. Calcified gallstones are
noted within the gallbladder lumen. No gallbladder wall thickening
or pericholecystic fluid. No biliary dilatation.

Pancreas: No focal lesion. Normal pancreatic contour. No surrounding
inflammatory changes. No main pancreatic ductal dilatation.

Spleen: Normal in size without focal abnormality.

Adrenals/Urinary Tract: No adrenal nodule bilaterally. No
nephrolithiasis, no hydronephrosis, and no contour-deforming renal
mass. No ureterolithiasis or hydroureter. The urinary bladder is
decompressed and grossly unremarkable.

Stomach/Bowel: Stomach is within normal limits. No evidence of bowel
wall thickening or dilatation. Appendix appears normal.

Vascular/Lymphatic: No abdominal aorta or iliac aneurysm. Mild
atherosclerotic plaque of the aorta and its branches. No abdominal,
pelvic, or inguinal lymphadenopathy.

Reproductive: Uterus and bilateral adnexa are unremarkable.

Other: No intraperitoneal free fluid. No intraperitoneal free gas.
No organized fluid collection.

Musculoskeletal:

Tiny fat containing umbilical hernia.  No inguinal hernia.

No suspicious lytic or blastic osseous lesions. No acute displaced
fracture. Similar-appearing concavity of the superior endplate of
the L5 vertebral body. Multilevel degenerative changes of the spine.
IMPRESSION: 1. No nephroureterolithiasis. No acute intra-abdominal or
intrapelvic abnormality.
2. Cholelithiasis with no CT evidence of acute cholecystitis.
3.  Aortic Atherosclerosis ([id number].N).

## 2022-09-30 DIAGNOSIS — E118 Type 2 diabetes mellitus with unspecified complications: Secondary | ICD-10-CM | POA: Diagnosis not present

## 2022-09-30 DIAGNOSIS — E785 Hyperlipidemia, unspecified: Secondary | ICD-10-CM | POA: Diagnosis not present

## 2022-09-30 DIAGNOSIS — D582 Other hemoglobinopathies: Secondary | ICD-10-CM | POA: Diagnosis not present

## 2022-09-30 DIAGNOSIS — I700 Atherosclerosis of aorta: Secondary | ICD-10-CM | POA: Diagnosis not present

## 2022-09-30 DIAGNOSIS — J4540 Moderate persistent asthma, uncomplicated: Secondary | ICD-10-CM | POA: Diagnosis not present

## 2022-10-07 DIAGNOSIS — Z794 Long term (current) use of insulin: Secondary | ICD-10-CM | POA: Diagnosis not present

## 2022-10-07 DIAGNOSIS — J9610 Chronic respiratory failure, unspecified whether with hypoxia or hypercapnia: Secondary | ICD-10-CM | POA: Diagnosis not present

## 2022-10-07 DIAGNOSIS — D582 Other hemoglobinopathies: Secondary | ICD-10-CM | POA: Diagnosis not present

## 2022-10-07 DIAGNOSIS — I10 Essential (primary) hypertension: Secondary | ICD-10-CM | POA: Diagnosis not present

## 2022-10-07 DIAGNOSIS — E119 Type 2 diabetes mellitus without complications: Secondary | ICD-10-CM | POA: Diagnosis not present

## 2022-10-07 DIAGNOSIS — F319 Bipolar disorder, unspecified: Secondary | ICD-10-CM | POA: Diagnosis not present

## 2022-10-07 DIAGNOSIS — Z1331 Encounter for screening for depression: Secondary | ICD-10-CM | POA: Diagnosis not present

## 2022-10-07 DIAGNOSIS — I700 Atherosclerosis of aorta: Secondary | ICD-10-CM | POA: Diagnosis not present

## 2022-10-07 DIAGNOSIS — Z1389 Encounter for screening for other disorder: Secondary | ICD-10-CM | POA: Diagnosis not present

## 2022-10-07 DIAGNOSIS — E118 Type 2 diabetes mellitus with unspecified complications: Secondary | ICD-10-CM | POA: Diagnosis not present

## 2022-10-07 DIAGNOSIS — Z8679 Personal history of other diseases of the circulatory system: Secondary | ICD-10-CM | POA: Diagnosis not present

## 2022-10-07 DIAGNOSIS — M19012 Primary osteoarthritis, left shoulder: Secondary | ICD-10-CM | POA: Diagnosis not present

## 2022-10-07 DIAGNOSIS — M25512 Pain in left shoulder: Secondary | ICD-10-CM | POA: Diagnosis not present

## 2022-10-07 DIAGNOSIS — Z0000 Encounter for general adult medical examination without abnormal findings: Secondary | ICD-10-CM | POA: Diagnosis not present

## 2022-10-07 DIAGNOSIS — S2232XA Fracture of one rib, left side, initial encounter for closed fracture: Secondary | ICD-10-CM | POA: Diagnosis not present

## 2022-10-07 DIAGNOSIS — J4489 Other specified chronic obstructive pulmonary disease: Secondary | ICD-10-CM | POA: Diagnosis not present

## 2022-10-21 DIAGNOSIS — M7502 Adhesive capsulitis of left shoulder: Secondary | ICD-10-CM | POA: Diagnosis not present

## 2022-10-21 DIAGNOSIS — S46012A Strain of muscle(s) and tendon(s) of the rotator cuff of left shoulder, initial encounter: Secondary | ICD-10-CM | POA: Diagnosis not present

## 2022-10-21 DIAGNOSIS — M25512 Pain in left shoulder: Secondary | ICD-10-CM | POA: Diagnosis not present

## 2022-10-21 DIAGNOSIS — K921 Melena: Secondary | ICD-10-CM | POA: Diagnosis not present

## 2022-10-27 ENCOUNTER — Ambulatory Visit
Admission: RE | Admit: 2022-10-27 | Discharge: 2022-10-27 | Disposition: A | Discharge status: Home / Self Care | Payer: Medicare HMO | Source: Ambulatory Visit | Attending: Internal Medicine | Admitting: Internal Medicine

## 2022-10-27 ENCOUNTER — Other Ambulatory Visit: Payer: Self-pay | Admitting: Student

## 2022-10-27 DIAGNOSIS — Z1231 Encounter for screening mammogram for malignant neoplasm of breast: Secondary | ICD-10-CM | POA: Insufficient documentation

## 2022-10-27 DIAGNOSIS — J449 Chronic obstructive pulmonary disease, unspecified: Secondary | ICD-10-CM | POA: Diagnosis not present

## 2022-10-27 DIAGNOSIS — G479 Sleep disorder, unspecified: Secondary | ICD-10-CM | POA: Diagnosis not present

## 2022-10-27 DIAGNOSIS — M7502 Adhesive capsulitis of left shoulder: Secondary | ICD-10-CM

## 2022-10-27 DIAGNOSIS — S46012A Strain of muscle(s) and tendon(s) of the rotator cuff of left shoulder, initial encounter: Secondary | ICD-10-CM

## 2022-10-27 DIAGNOSIS — R0609 Other forms of dyspnea: Secondary | ICD-10-CM | POA: Diagnosis not present

## 2022-10-27 DIAGNOSIS — Z9981 Dependence on supplemental oxygen: Secondary | ICD-10-CM | POA: Diagnosis not present

## 2022-10-27 DIAGNOSIS — M25512 Pain in left shoulder: Secondary | ICD-10-CM

## 2022-10-31 DIAGNOSIS — K921 Melena: Secondary | ICD-10-CM | POA: Diagnosis not present

## 2022-11-05 ENCOUNTER — Encounter: Payer: Self-pay | Admitting: Student

## 2022-11-08 ENCOUNTER — Other Ambulatory Visit: Payer: Medicare HMO

## 2022-12-18 DIAGNOSIS — Z1211 Encounter for screening for malignant neoplasm of colon: Secondary | ICD-10-CM | POA: Diagnosis not present

## 2022-12-22 DIAGNOSIS — J9611 Chronic respiratory failure with hypoxia: Secondary | ICD-10-CM | POA: Diagnosis not present

## 2022-12-22 DIAGNOSIS — R0600 Dyspnea, unspecified: Secondary | ICD-10-CM | POA: Diagnosis not present

## 2022-12-22 DIAGNOSIS — F319 Bipolar disorder, unspecified: Secondary | ICD-10-CM | POA: Diagnosis not present

## 2022-12-22 DIAGNOSIS — I700 Atherosclerosis of aorta: Secondary | ICD-10-CM | POA: Diagnosis not present

## 2022-12-22 DIAGNOSIS — J42 Unspecified chronic bronchitis: Secondary | ICD-10-CM | POA: Diagnosis not present

## 2022-12-22 DIAGNOSIS — E119 Type 2 diabetes mellitus without complications: Secondary | ICD-10-CM | POA: Diagnosis not present

## 2022-12-22 DIAGNOSIS — S22069A Unspecified fracture of T7-T8 vertebra, initial encounter for closed fracture: Secondary | ICD-10-CM | POA: Diagnosis not present

## 2022-12-22 DIAGNOSIS — I5020 Unspecified systolic (congestive) heart failure: Secondary | ICD-10-CM | POA: Diagnosis not present

## 2022-12-22 DIAGNOSIS — I110 Hypertensive heart disease with heart failure: Secondary | ICD-10-CM | POA: Diagnosis not present

## 2022-12-22 DIAGNOSIS — Z87891 Personal history of nicotine dependence: Secondary | ICD-10-CM | POA: Diagnosis not present

## 2022-12-22 DIAGNOSIS — J441 Chronic obstructive pulmonary disease with (acute) exacerbation: Secondary | ICD-10-CM | POA: Diagnosis not present

## 2022-12-25 LAB — EXTERNAL GENERIC LAB PROCEDURE: COLOGUARD: POSITIVE — AB

## 2023-01-05 ENCOUNTER — Other Ambulatory Visit: Payer: Self-pay | Admitting: Internal Medicine

## 2023-01-05 DIAGNOSIS — S22000A Wedge compression fracture of unspecified thoracic vertebra, initial encounter for closed fracture: Secondary | ICD-10-CM

## 2023-01-06 ENCOUNTER — Ambulatory Visit
Admission: RE | Admit: 2023-01-06 | Discharge: 2023-01-06 | Disposition: A | Discharge status: Home / Self Care | Payer: Medicare HMO | Source: Ambulatory Visit | Attending: Internal Medicine | Admitting: Internal Medicine

## 2023-01-06 DIAGNOSIS — M47814 Spondylosis without myelopathy or radiculopathy, thoracic region: Secondary | ICD-10-CM | POA: Diagnosis not present

## 2023-01-06 DIAGNOSIS — S22000A Wedge compression fracture of unspecified thoracic vertebra, initial encounter for closed fracture: Secondary | ICD-10-CM | POA: Diagnosis not present

## 2023-01-06 DIAGNOSIS — M4804 Spinal stenosis, thoracic region: Secondary | ICD-10-CM | POA: Diagnosis not present

## 2023-01-06 DIAGNOSIS — R609 Edema, unspecified: Secondary | ICD-10-CM | POA: Diagnosis not present

## 2023-01-06 DIAGNOSIS — M40204 Unspecified kyphosis, thoracic region: Secondary | ICD-10-CM | POA: Diagnosis not present

## 2023-01-12 ENCOUNTER — Encounter: Payer: Self-pay | Admitting: Cardiovascular Disease

## 2023-01-13 ENCOUNTER — Encounter: Payer: Medicare HMO | Admitting: Cardiovascular Disease

## 2023-02-04 NOTE — Progress Notes (Signed)
 This encounter was created in error - please disregard.

## 2023-02-04 DEATH — deceased
